# Patient Record
Sex: Male | Born: 1955 | Race: White | Hispanic: No | Marital: Married | Smoking: Former smoker
Health system: Southern US, Community
[De-identification: ages and names within clinical notes are randomized; demographics above are authoritative.]

## PROBLEM LIST (undated history)

## (undated) DIAGNOSIS — E7800 Pure hypercholesterolemia, unspecified: Secondary | ICD-10-CM

## (undated) DIAGNOSIS — Z952 Presence of prosthetic heart valve: Secondary | ICD-10-CM

## (undated) DIAGNOSIS — I714 Abdominal aortic aneurysm, without rupture, unspecified: Secondary | ICD-10-CM

## (undated) DIAGNOSIS — J449 Chronic obstructive pulmonary disease, unspecified: Secondary | ICD-10-CM

## (undated) DIAGNOSIS — I4891 Unspecified atrial fibrillation: Secondary | ICD-10-CM

## (undated) HISTORY — DX: Abdominal aortic aneurysm, without rupture: I71.4

## (undated) HISTORY — DX: Abdominal aortic aneurysm, without rupture, unspecified: I71.40

---

## 1994-05-19 HISTORY — PX: CARDIAC SURGERY: SHX584

## 2007-07-09 ENCOUNTER — Emergency Department: Payer: Self-pay | Admitting: Emergency Medicine

## 2007-07-09 ENCOUNTER — Other Ambulatory Visit: Payer: Self-pay

## 2009-04-23 ENCOUNTER — Ambulatory Visit: Payer: Self-pay | Admitting: General Practice

## 2009-07-04 ENCOUNTER — Ambulatory Visit: Payer: Self-pay | Admitting: General Practice

## 2010-03-29 ENCOUNTER — Emergency Department: Payer: Self-pay | Admitting: Emergency Medicine

## 2010-08-08 ENCOUNTER — Ambulatory Visit: Payer: Self-pay | Admitting: Internal Medicine

## 2010-08-28 ENCOUNTER — Ambulatory Visit: Payer: Self-pay | Admitting: Internal Medicine

## 2010-10-18 ENCOUNTER — Ambulatory Visit: Payer: Self-pay | Admitting: Internal Medicine

## 2010-11-11 ENCOUNTER — Ambulatory Visit: Payer: Self-pay | Admitting: Gastroenterology

## 2010-11-14 LAB — PATHOLOGY REPORT

## 2011-03-24 ENCOUNTER — Emergency Department: Payer: Self-pay | Admitting: Emergency Medicine

## 2011-03-25 ENCOUNTER — Ambulatory Visit: Payer: Self-pay | Admitting: Internal Medicine

## 2011-05-15 ENCOUNTER — Ambulatory Visit: Payer: Self-pay | Admitting: General Practice

## 2014-08-10 ENCOUNTER — Emergency Department: Payer: Self-pay | Admitting: Emergency Medicine

## 2014-08-10 LAB — URINALYSIS, COMPLETE
BILIRUBIN, UR: NEGATIVE
Bacteria: NONE SEEN
Blood: NEGATIVE
Glucose,UR: NEGATIVE mg/dL (ref 0–75)
Ketone: NEGATIVE
Leukocyte Esterase: NEGATIVE
Nitrite: NEGATIVE
PH: 6 (ref 4.5–8.0)
PROTEIN: NEGATIVE
RBC,UR: NONE SEEN /HPF (ref 0–5)
SPECIFIC GRAVITY: 1.006 (ref 1.003–1.030)
WBC UR: NONE SEEN /HPF (ref 0–5)

## 2014-08-10 LAB — COMPREHENSIVE METABOLIC PANEL
ALK PHOS: 48 U/L
ANION GAP: 10 (ref 7–16)
Albumin: 4 g/dL
BILIRUBIN TOTAL: 0.7 mg/dL
BUN: 17 mg/dL
CALCIUM: 8.8 mg/dL — AB
CREATININE: 1.29 mg/dL — AB
Chloride: 105 mmol/L
Co2: 22 mmol/L
Glucose: 175 mg/dL — ABNORMAL HIGH
Potassium: 3.6 mmol/L
SGOT(AST): 34 U/L
SGPT (ALT): 22 U/L
Sodium: 137 mmol/L
Total Protein: 6.5 g/dL

## 2014-08-10 LAB — CBC
HCT: 46.8 % (ref 40.0–52.0)
HGB: 15.7 g/dL (ref 13.0–18.0)
MCH: 27.5 pg (ref 26.0–34.0)
MCHC: 33.5 g/dL (ref 32.0–36.0)
MCV: 82 fL (ref 80–100)
PLATELETS: 248 10*3/uL (ref 150–440)
RBC: 5.69 10*6/uL (ref 4.40–5.90)
RDW: 14.6 % — ABNORMAL HIGH (ref 11.5–14.5)
WBC: 9.5 10*3/uL (ref 3.8–10.6)

## 2014-08-10 LAB — PROTIME-INR
INR: 2.7
PROTHROMBIN TIME: 29.1 s — AB

## 2014-08-10 LAB — CK TOTAL AND CKMB (NOT AT ARMC)
CK, Total: 209 U/L
CK-MB: 1.1 ng/mL

## 2014-08-10 LAB — TROPONIN I

## 2014-08-10 LAB — MAGNESIUM: Magnesium: 1.9 mg/dL

## 2015-03-12 ENCOUNTER — Other Ambulatory Visit: Payer: Self-pay | Admitting: Neurology

## 2015-03-12 DIAGNOSIS — R413 Other amnesia: Secondary | ICD-10-CM

## 2015-03-14 ENCOUNTER — Ambulatory Visit
Admission: RE | Admit: 2015-03-14 | Discharge: 2015-03-14 | Disposition: A | Discharge status: Home / Self Care | Payer: 59 | Source: Ambulatory Visit | Attending: Neurology | Admitting: Neurology

## 2015-03-14 DIAGNOSIS — R413 Other amnesia: Secondary | ICD-10-CM | POA: Diagnosis present

## 2015-03-14 DIAGNOSIS — R9082 White matter disease, unspecified: Secondary | ICD-10-CM | POA: Insufficient documentation

## 2015-03-23 ENCOUNTER — Ambulatory Visit: Payer: 59 | Attending: Neurology

## 2015-03-23 DIAGNOSIS — G4733 Obstructive sleep apnea (adult) (pediatric): Secondary | ICD-10-CM | POA: Insufficient documentation

## 2015-05-29 DIAGNOSIS — Z952 Presence of prosthetic heart valve: Secondary | ICD-10-CM | POA: Diagnosis not present

## 2015-07-05 DIAGNOSIS — E782 Mixed hyperlipidemia: Secondary | ICD-10-CM | POA: Diagnosis not present

## 2015-07-05 DIAGNOSIS — G4733 Obstructive sleep apnea (adult) (pediatric): Secondary | ICD-10-CM | POA: Diagnosis not present

## 2015-07-05 DIAGNOSIS — I480 Paroxysmal atrial fibrillation: Secondary | ICD-10-CM | POA: Diagnosis not present

## 2015-07-05 DIAGNOSIS — I350 Nonrheumatic aortic (valve) stenosis: Secondary | ICD-10-CM | POA: Diagnosis not present

## 2015-07-11 DIAGNOSIS — E782 Mixed hyperlipidemia: Secondary | ICD-10-CM | POA: Diagnosis not present

## 2015-07-11 DIAGNOSIS — R972 Elevated prostate specific antigen [PSA]: Secondary | ICD-10-CM | POA: Diagnosis not present

## 2015-07-11 DIAGNOSIS — I480 Paroxysmal atrial fibrillation: Secondary | ICD-10-CM | POA: Diagnosis not present

## 2015-07-18 DIAGNOSIS — G4733 Obstructive sleep apnea (adult) (pediatric): Secondary | ICD-10-CM | POA: Diagnosis not present

## 2015-07-18 DIAGNOSIS — R413 Other amnesia: Secondary | ICD-10-CM | POA: Diagnosis not present

## 2015-07-18 DIAGNOSIS — E782 Mixed hyperlipidemia: Secondary | ICD-10-CM | POA: Diagnosis not present

## 2015-07-18 DIAGNOSIS — E291 Testicular hypofunction: Secondary | ICD-10-CM | POA: Diagnosis not present

## 2015-07-25 DIAGNOSIS — I350 Nonrheumatic aortic (valve) stenosis: Secondary | ICD-10-CM | POA: Diagnosis not present

## 2015-07-26 DIAGNOSIS — X32XXXA Exposure to sunlight, initial encounter: Secondary | ICD-10-CM | POA: Diagnosis not present

## 2015-07-26 DIAGNOSIS — D2271 Melanocytic nevi of right lower limb, including hip: Secondary | ICD-10-CM | POA: Diagnosis not present

## 2015-07-26 DIAGNOSIS — D225 Melanocytic nevi of trunk: Secondary | ICD-10-CM | POA: Diagnosis not present

## 2015-07-26 DIAGNOSIS — D2262 Melanocytic nevi of left upper limb, including shoulder: Secondary | ICD-10-CM | POA: Diagnosis not present

## 2015-07-26 DIAGNOSIS — D2272 Melanocytic nevi of left lower limb, including hip: Secondary | ICD-10-CM | POA: Diagnosis not present

## 2015-07-26 DIAGNOSIS — L570 Actinic keratosis: Secondary | ICD-10-CM | POA: Diagnosis not present

## 2015-08-07 DIAGNOSIS — I350 Nonrheumatic aortic (valve) stenosis: Secondary | ICD-10-CM | POA: Diagnosis not present

## 2015-08-21 DIAGNOSIS — I350 Nonrheumatic aortic (valve) stenosis: Secondary | ICD-10-CM | POA: Diagnosis not present

## 2015-09-03 DIAGNOSIS — I350 Nonrheumatic aortic (valve) stenosis: Secondary | ICD-10-CM | POA: Diagnosis not present

## 2015-09-12 DIAGNOSIS — I350 Nonrheumatic aortic (valve) stenosis: Secondary | ICD-10-CM | POA: Diagnosis not present

## 2015-10-10 DIAGNOSIS — I350 Nonrheumatic aortic (valve) stenosis: Secondary | ICD-10-CM | POA: Diagnosis not present

## 2015-10-24 DIAGNOSIS — I350 Nonrheumatic aortic (valve) stenosis: Secondary | ICD-10-CM | POA: Diagnosis not present

## 2015-11-21 DIAGNOSIS — I350 Nonrheumatic aortic (valve) stenosis: Secondary | ICD-10-CM | POA: Diagnosis not present

## 2015-12-10 DIAGNOSIS — I350 Nonrheumatic aortic (valve) stenosis: Secondary | ICD-10-CM | POA: Diagnosis not present

## 2015-12-24 DIAGNOSIS — I350 Nonrheumatic aortic (valve) stenosis: Secondary | ICD-10-CM | POA: Diagnosis not present

## 2016-01-02 DIAGNOSIS — I350 Nonrheumatic aortic (valve) stenosis: Secondary | ICD-10-CM | POA: Diagnosis not present

## 2016-01-02 DIAGNOSIS — E782 Mixed hyperlipidemia: Secondary | ICD-10-CM | POA: Diagnosis not present

## 2016-01-02 DIAGNOSIS — I480 Paroxysmal atrial fibrillation: Secondary | ICD-10-CM | POA: Diagnosis not present

## 2016-01-02 DIAGNOSIS — G4733 Obstructive sleep apnea (adult) (pediatric): Secondary | ICD-10-CM | POA: Diagnosis not present

## 2016-01-10 DIAGNOSIS — E782 Mixed hyperlipidemia: Secondary | ICD-10-CM | POA: Diagnosis not present

## 2016-01-10 DIAGNOSIS — Z125 Encounter for screening for malignant neoplasm of prostate: Secondary | ICD-10-CM | POA: Diagnosis not present

## 2016-01-10 DIAGNOSIS — I480 Paroxysmal atrial fibrillation: Secondary | ICD-10-CM | POA: Diagnosis not present

## 2016-01-10 DIAGNOSIS — E291 Testicular hypofunction: Secondary | ICD-10-CM | POA: Diagnosis not present

## 2016-01-10 DIAGNOSIS — I350 Nonrheumatic aortic (valve) stenosis: Secondary | ICD-10-CM | POA: Diagnosis not present

## 2016-01-17 DIAGNOSIS — I480 Paroxysmal atrial fibrillation: Secondary | ICD-10-CM | POA: Diagnosis not present

## 2016-01-17 DIAGNOSIS — I350 Nonrheumatic aortic (valve) stenosis: Secondary | ICD-10-CM | POA: Diagnosis not present

## 2016-01-17 DIAGNOSIS — E782 Mixed hyperlipidemia: Secondary | ICD-10-CM | POA: Diagnosis not present

## 2016-01-17 DIAGNOSIS — R972 Elevated prostate specific antigen [PSA]: Secondary | ICD-10-CM | POA: Diagnosis not present

## 2016-02-11 DIAGNOSIS — I350 Nonrheumatic aortic (valve) stenosis: Secondary | ICD-10-CM | POA: Diagnosis not present

## 2016-02-15 DIAGNOSIS — Z1211 Encounter for screening for malignant neoplasm of colon: Secondary | ICD-10-CM | POA: Diagnosis not present

## 2016-02-15 DIAGNOSIS — Z7901 Long term (current) use of anticoagulants: Secondary | ICD-10-CM | POA: Diagnosis not present

## 2016-02-16 ENCOUNTER — Other Ambulatory Visit: Payer: Self-pay | Admitting: Gastroenterology

## 2016-02-16 DIAGNOSIS — Z7901 Long term (current) use of anticoagulants: Secondary | ICD-10-CM

## 2016-02-16 DIAGNOSIS — Z1211 Encounter for screening for malignant neoplasm of colon: Secondary | ICD-10-CM

## 2016-02-19 DIAGNOSIS — I350 Nonrheumatic aortic (valve) stenosis: Secondary | ICD-10-CM | POA: Diagnosis not present

## 2016-03-03 ENCOUNTER — Ambulatory Visit: Payer: 59

## 2016-03-07 ENCOUNTER — Ambulatory Visit
Admission: RE | Admit: 2016-03-07 | Discharge: 2016-03-07 | Disposition: A | Discharge status: Home / Self Care | Payer: 59 | Source: Ambulatory Visit | Attending: Gastroenterology | Admitting: Gastroenterology

## 2016-03-07 DIAGNOSIS — Z7901 Long term (current) use of anticoagulants: Secondary | ICD-10-CM

## 2016-03-07 DIAGNOSIS — Z1211 Encounter for screening for malignant neoplasm of colon: Secondary | ICD-10-CM | POA: Diagnosis not present

## 2016-03-18 DIAGNOSIS — I350 Nonrheumatic aortic (valve) stenosis: Secondary | ICD-10-CM | POA: Diagnosis not present

## 2016-03-31 DIAGNOSIS — I350 Nonrheumatic aortic (valve) stenosis: Secondary | ICD-10-CM | POA: Diagnosis not present

## 2016-04-28 DIAGNOSIS — I350 Nonrheumatic aortic (valve) stenosis: Secondary | ICD-10-CM | POA: Diagnosis not present

## 2016-04-29 ENCOUNTER — Other Ambulatory Visit: Payer: Self-pay | Admitting: Cardiology

## 2016-04-29 DIAGNOSIS — I350 Nonrheumatic aortic (valve) stenosis: Secondary | ICD-10-CM

## 2016-05-07 ENCOUNTER — Ambulatory Visit: Payer: 59 | Attending: Cardiology

## 2016-05-14 DIAGNOSIS — I350 Nonrheumatic aortic (valve) stenosis: Secondary | ICD-10-CM | POA: Diagnosis not present

## 2016-05-28 DIAGNOSIS — I350 Nonrheumatic aortic (valve) stenosis: Secondary | ICD-10-CM | POA: Diagnosis not present

## 2016-06-17 DIAGNOSIS — I480 Paroxysmal atrial fibrillation: Secondary | ICD-10-CM | POA: Diagnosis not present

## 2016-06-24 DIAGNOSIS — I480 Paroxysmal atrial fibrillation: Secondary | ICD-10-CM | POA: Diagnosis not present

## 2016-07-10 DIAGNOSIS — I350 Nonrheumatic aortic (valve) stenosis: Secondary | ICD-10-CM | POA: Diagnosis not present

## 2016-07-10 DIAGNOSIS — E349 Endocrine disorder, unspecified: Secondary | ICD-10-CM | POA: Diagnosis not present

## 2016-07-10 DIAGNOSIS — I480 Paroxysmal atrial fibrillation: Secondary | ICD-10-CM | POA: Diagnosis not present

## 2016-07-10 DIAGNOSIS — E782 Mixed hyperlipidemia: Secondary | ICD-10-CM | POA: Diagnosis not present

## 2016-07-24 DIAGNOSIS — G4733 Obstructive sleep apnea (adult) (pediatric): Secondary | ICD-10-CM | POA: Diagnosis not present

## 2016-07-24 DIAGNOSIS — I480 Paroxysmal atrial fibrillation: Secondary | ICD-10-CM | POA: Diagnosis not present

## 2016-07-24 DIAGNOSIS — E782 Mixed hyperlipidemia: Secondary | ICD-10-CM | POA: Diagnosis not present

## 2016-07-24 DIAGNOSIS — Z720 Tobacco use: Secondary | ICD-10-CM | POA: Diagnosis not present

## 2016-07-25 DIAGNOSIS — X32XXXA Exposure to sunlight, initial encounter: Secondary | ICD-10-CM | POA: Diagnosis not present

## 2016-07-25 DIAGNOSIS — D2261 Melanocytic nevi of right upper limb, including shoulder: Secondary | ICD-10-CM | POA: Diagnosis not present

## 2016-07-25 DIAGNOSIS — D225 Melanocytic nevi of trunk: Secondary | ICD-10-CM | POA: Diagnosis not present

## 2016-07-25 DIAGNOSIS — L570 Actinic keratosis: Secondary | ICD-10-CM | POA: Diagnosis not present

## 2016-07-25 DIAGNOSIS — D2272 Melanocytic nevi of left lower limb, including hip: Secondary | ICD-10-CM | POA: Diagnosis not present

## 2016-07-25 DIAGNOSIS — L308 Other specified dermatitis: Secondary | ICD-10-CM | POA: Diagnosis not present

## 2016-08-04 DIAGNOSIS — Z7982 Long term (current) use of aspirin: Secondary | ICD-10-CM | POA: Diagnosis not present

## 2016-08-04 DIAGNOSIS — E7800 Pure hypercholesterolemia, unspecified: Secondary | ICD-10-CM | POA: Diagnosis not present

## 2016-08-04 DIAGNOSIS — R0600 Dyspnea, unspecified: Secondary | ICD-10-CM | POA: Diagnosis not present

## 2016-08-04 DIAGNOSIS — F17210 Nicotine dependence, cigarettes, uncomplicated: Secondary | ICD-10-CM | POA: Diagnosis not present

## 2016-08-04 DIAGNOSIS — Z7901 Long term (current) use of anticoagulants: Secondary | ICD-10-CM | POA: Diagnosis not present

## 2016-08-04 DIAGNOSIS — Z79899 Other long term (current) drug therapy: Secondary | ICD-10-CM | POA: Diagnosis not present

## 2016-08-04 DIAGNOSIS — I480 Paroxysmal atrial fibrillation: Secondary | ICD-10-CM | POA: Diagnosis not present

## 2016-08-11 DIAGNOSIS — I480 Paroxysmal atrial fibrillation: Secondary | ICD-10-CM | POA: Diagnosis not present

## 2016-08-25 DIAGNOSIS — Z7901 Long term (current) use of anticoagulants: Secondary | ICD-10-CM | POA: Diagnosis not present

## 2016-09-03 DIAGNOSIS — Z7901 Long term (current) use of anticoagulants: Secondary | ICD-10-CM | POA: Diagnosis not present

## 2016-10-01 DIAGNOSIS — I480 Paroxysmal atrial fibrillation: Secondary | ICD-10-CM | POA: Diagnosis not present

## 2016-10-29 DIAGNOSIS — I480 Paroxysmal atrial fibrillation: Secondary | ICD-10-CM | POA: Diagnosis not present

## 2016-11-12 DIAGNOSIS — Z5181 Encounter for therapeutic drug level monitoring: Secondary | ICD-10-CM | POA: Diagnosis not present

## 2016-11-12 DIAGNOSIS — Z7901 Long term (current) use of anticoagulants: Secondary | ICD-10-CM | POA: Diagnosis not present

## 2016-12-10 DIAGNOSIS — I480 Paroxysmal atrial fibrillation: Secondary | ICD-10-CM | POA: Diagnosis not present

## 2016-12-22 DIAGNOSIS — R791 Abnormal coagulation profile: Secondary | ICD-10-CM | POA: Diagnosis not present

## 2017-01-12 DIAGNOSIS — G4733 Obstructive sleep apnea (adult) (pediatric): Secondary | ICD-10-CM | POA: Diagnosis not present

## 2017-01-12 DIAGNOSIS — I480 Paroxysmal atrial fibrillation: Secondary | ICD-10-CM | POA: Diagnosis not present

## 2017-01-12 DIAGNOSIS — I350 Nonrheumatic aortic (valve) stenosis: Secondary | ICD-10-CM | POA: Diagnosis not present

## 2017-01-12 DIAGNOSIS — E782 Mixed hyperlipidemia: Secondary | ICD-10-CM | POA: Diagnosis not present

## 2017-01-28 DIAGNOSIS — E349 Endocrine disorder, unspecified: Secondary | ICD-10-CM | POA: Diagnosis not present

## 2017-01-28 DIAGNOSIS — I480 Paroxysmal atrial fibrillation: Secondary | ICD-10-CM | POA: Diagnosis not present

## 2017-01-28 DIAGNOSIS — E782 Mixed hyperlipidemia: Secondary | ICD-10-CM | POA: Diagnosis not present

## 2017-02-03 DIAGNOSIS — E782 Mixed hyperlipidemia: Secondary | ICD-10-CM | POA: Diagnosis not present

## 2017-02-03 DIAGNOSIS — G4733 Obstructive sleep apnea (adult) (pediatric): Secondary | ICD-10-CM | POA: Diagnosis not present

## 2017-02-03 DIAGNOSIS — M4696 Unspecified inflammatory spondylopathy, lumbar region: Secondary | ICD-10-CM | POA: Diagnosis not present

## 2017-02-03 DIAGNOSIS — Z0000 Encounter for general adult medical examination without abnormal findings: Secondary | ICD-10-CM | POA: Diagnosis not present

## 2017-02-11 DIAGNOSIS — Z7901 Long term (current) use of anticoagulants: Secondary | ICD-10-CM | POA: Diagnosis not present

## 2017-02-11 DIAGNOSIS — Z5181 Encounter for therapeutic drug level monitoring: Secondary | ICD-10-CM | POA: Diagnosis not present

## 2017-03-11 DIAGNOSIS — I480 Paroxysmal atrial fibrillation: Secondary | ICD-10-CM | POA: Diagnosis not present

## 2017-03-24 DIAGNOSIS — R791 Abnormal coagulation profile: Secondary | ICD-10-CM | POA: Diagnosis not present

## 2017-04-07 DIAGNOSIS — R791 Abnormal coagulation profile: Secondary | ICD-10-CM | POA: Diagnosis not present

## 2017-04-16 DIAGNOSIS — H524 Presbyopia: Secondary | ICD-10-CM | POA: Diagnosis not present

## 2018-04-06 ENCOUNTER — Emergency Department: Payer: PRIVATE HEALTH INSURANCE

## 2018-04-06 ENCOUNTER — Other Ambulatory Visit: Payer: Self-pay

## 2018-04-06 ENCOUNTER — Encounter: Payer: Self-pay | Admitting: Emergency Medicine

## 2018-04-06 ENCOUNTER — Observation Stay
Admission: EM | Admit: 2018-04-06 | Discharge: 2018-04-07 | Disposition: A | Discharge status: Home / Self Care | Payer: PRIVATE HEALTH INSURANCE | Attending: Internal Medicine | Admitting: Internal Medicine

## 2018-04-06 DIAGNOSIS — Z880 Allergy status to penicillin: Secondary | ICD-10-CM | POA: Diagnosis not present

## 2018-04-06 DIAGNOSIS — Z7901 Long term (current) use of anticoagulants: Secondary | ICD-10-CM | POA: Diagnosis not present

## 2018-04-06 DIAGNOSIS — I4891 Unspecified atrial fibrillation: Secondary | ICD-10-CM

## 2018-04-06 DIAGNOSIS — Z79899 Other long term (current) drug therapy: Secondary | ICD-10-CM | POA: Insufficient documentation

## 2018-04-06 DIAGNOSIS — E7800 Pure hypercholesterolemia, unspecified: Secondary | ICD-10-CM | POA: Diagnosis not present

## 2018-04-06 DIAGNOSIS — I480 Paroxysmal atrial fibrillation: Principal | ICD-10-CM | POA: Insufficient documentation

## 2018-04-06 DIAGNOSIS — F17200 Nicotine dependence, unspecified, uncomplicated: Secondary | ICD-10-CM | POA: Diagnosis not present

## 2018-04-06 DIAGNOSIS — I7121 Aneurysm of the ascending aorta, without rupture: Secondary | ICD-10-CM

## 2018-04-06 DIAGNOSIS — Z7982 Long term (current) use of aspirin: Secondary | ICD-10-CM | POA: Insufficient documentation

## 2018-04-06 DIAGNOSIS — Z8249 Family history of ischemic heart disease and other diseases of the circulatory system: Secondary | ICD-10-CM | POA: Diagnosis not present

## 2018-04-06 DIAGNOSIS — Z952 Presence of prosthetic heart valve: Secondary | ICD-10-CM | POA: Insufficient documentation

## 2018-04-06 DIAGNOSIS — Z23 Encounter for immunization: Secondary | ICD-10-CM | POA: Insufficient documentation

## 2018-04-06 DIAGNOSIS — I712 Thoracic aortic aneurysm, without rupture: Secondary | ICD-10-CM | POA: Diagnosis present

## 2018-04-06 DIAGNOSIS — I4892 Unspecified atrial flutter: Secondary | ICD-10-CM

## 2018-04-06 DIAGNOSIS — I714 Abdominal aortic aneurysm, without rupture: Secondary | ICD-10-CM | POA: Diagnosis not present

## 2018-04-06 HISTORY — DX: Presence of prosthetic heart valve: Z95.2

## 2018-04-06 HISTORY — DX: Unspecified atrial fibrillation: I48.91

## 2018-04-06 HISTORY — DX: Pure hypercholesterolemia, unspecified: E78.00

## 2018-04-06 LAB — BASIC METABOLIC PANEL
ANION GAP: 10 (ref 5–15)
BUN: 12 mg/dL (ref 6–20)
CHLORIDE: 107 mmol/L (ref 98–111)
CO2: 22 mmol/L (ref 22–32)
Calcium: 9 mg/dL (ref 8.9–10.3)
Creatinine, Ser: 1.08 mg/dL (ref 0.61–1.24)
GFR calc Af Amer: >60 mL/min (ref >60)
GFR calc non Af Amer: >60 mL/min (ref >60)
GLUCOSE: 113 mg/dL — AB (ref 70–99)
POTASSIUM: 4.5 mmol/L (ref 3.5–5.1)
Sodium: 139 mmol/L (ref 135–145)

## 2018-04-06 LAB — CBC WITH DIFFERENTIAL/PLATELET
Abs Immature Granulocytes: 0.05 10*3/uL (ref 0.00–0.07)
BASOS ABS: 0.1 10*3/uL (ref 0.0–0.1)
Basophils Relative: 1 %
EOS ABS: 0.2 10*3/uL (ref 0.0–0.5)
Eosinophils Relative: 2 %
HEMATOCRIT: 51.2 % (ref 39.0–52.0)
HEMOGLOBIN: 17.6 g/dL — AB (ref 13.0–17.0)
Immature Granulocytes: 1 %
LYMPHS ABS: 1.9 10*3/uL (ref 0.7–4.0)
Lymphocytes Relative: 20 %
MCH: 28.7 pg (ref 26.0–34.0)
MCHC: 34.4 g/dL (ref 30.0–36.0)
MCV: 83.5 fL (ref 80.0–100.0)
MONOS PCT: 8 %
Monocytes Absolute: 0.8 10*3/uL (ref 0.1–1.0)
NEUTROS PCT: 68 %
NRBC: 0 % (ref 0.0–0.2)
Neutro Abs: 6.7 10*3/uL (ref 1.7–7.7)
Platelets: 312 10*3/uL (ref 150–400)
RBC: 6.13 MIL/uL — ABNORMAL HIGH (ref 4.22–5.81)
RDW: 13.6 % (ref 11.5–15.5)
WBC: 9.7 10*3/uL (ref 4.0–10.5)

## 2018-04-06 LAB — TROPONIN I
TROPONIN I: 0.03 ng/mL — AB (ref <0.03)
Troponin I: <0.03 ng/mL (ref <0.03)
Troponin I: 0.03 ng/mL (ref <0.03)

## 2018-04-06 LAB — PROTIME-INR
INR: 3.01
Prothrombin Time: 30.8 seconds — ABNORMAL HIGH (ref 11.4–15.2)

## 2018-04-06 LAB — TSH: TSH: 2.309 u[IU]/mL (ref 0.350–4.500)

## 2018-04-06 MED ORDER — ASPIRIN EC 81 MG PO TBEC
81.0000 mg | DELAYED_RELEASE_TABLET | Freq: Every day | ORAL | Status: DC
Start: 2018-04-07 — End: 2018-04-07
  Administered 2018-04-07: 81 mg via ORAL
  Filled 2018-04-06: qty 1

## 2018-04-06 MED ORDER — SODIUM CHLORIDE 0.9 % IV BOLUS
1000.0000 mL | Freq: Once | INTRAVENOUS | Status: AC
  Administered 2018-04-06: 1000 mL via INTRAVENOUS

## 2018-04-06 MED ORDER — SERTRALINE HCL 50 MG PO TABS
25.0000 mg | ORAL_TABLET | Freq: Every day | ORAL | Status: DC
  Administered 2018-04-06 – 2018-04-07 (×2): 25 mg via ORAL
  Filled 2018-04-06 (×2): qty 1

## 2018-04-06 MED ORDER — INFLUENZA VAC SPLIT QUAD 0.5 ML IM SUSY
0.5000 mL | PREFILLED_SYRINGE | INTRAMUSCULAR | Status: AC
  Administered 2018-04-07: 0.5 mL via INTRAMUSCULAR
  Filled 2018-04-06: qty 0.5

## 2018-04-06 MED ORDER — METOPROLOL TARTRATE 25 MG PO TABS
25.0000 mg | ORAL_TABLET | Freq: Two times a day (BID) | ORAL | Status: DC
  Filled 2018-04-06 (×2): qty 1

## 2018-04-06 MED ORDER — WARFARIN - PHARMACIST DOSING INPATIENT
Freq: Every day | Status: DC
  Filled 2018-04-06: qty 1

## 2018-04-06 MED ORDER — DILTIAZEM HCL-DEXTROSE 100-5 MG/100ML-% IV SOLN (PREMIX)
5.0000 mg/h | INTRAVENOUS | Status: DC
  Administered 2018-04-06: 5 mg/h via INTRAVENOUS
  Filled 2018-04-06: qty 100

## 2018-04-06 MED ORDER — SODIUM CHLORIDE 0.9 % IV BOLUS
500.0000 mL | Freq: Once | INTRAVENOUS | Status: AC
  Administered 2018-04-06: 500 mL via INTRAVENOUS

## 2018-04-06 MED ORDER — METOPROLOL TARTRATE 5 MG/5ML IV SOLN
5.0000 mg | Freq: Once | INTRAVENOUS | Status: AC
  Administered 2018-04-06: 5 mg via INTRAVENOUS
  Filled 2018-04-06: qty 5

## 2018-04-06 MED ORDER — DOCUSATE SODIUM 100 MG PO CAPS: 100.0000 mg | ORAL_CAPSULE | Freq: Two times a day (BID) | ORAL | Status: DC | PRN

## 2018-04-06 MED ORDER — EZETIMIBE 10 MG PO TABS
10.0000 mg | ORAL_TABLET | Freq: Every day | ORAL | Status: DC
  Administered 2018-04-06 – 2018-04-07 (×2): 10 mg via ORAL
  Filled 2018-04-06 (×2): qty 1

## 2018-04-06 MED ORDER — TRAZODONE HCL 50 MG PO TABS
50.0000 mg | ORAL_TABLET | Freq: Every day | ORAL | Status: DC
  Administered 2018-04-06: 50 mg via ORAL
  Filled 2018-04-06: qty 1

## 2018-04-06 MED ORDER — WARFARIN SODIUM 6 MG PO TABS
9.0000 mg | ORAL_TABLET | Freq: Every morning | ORAL | Status: DC
  Filled 2018-04-06: qty 1

## 2018-04-06 MED ORDER — IOHEXOL 350 MG/ML SOLN
75.0000 mL | Freq: Once | INTRAVENOUS | Status: AC | PRN
  Administered 2018-04-06: 75 mL via INTRAVENOUS

## 2018-04-06 NOTE — ED Notes (Signed)
Dr. Marthann Schiller paged regarding patients blood pressure. MD comfortable with patients current floor status.

## 2018-04-06 NOTE — Progress Notes (Signed)
Family Meeting Note  Advance Directive:yes  Today a meeting took place with the Patient and spouse.   The following clinical team members were present during this meeting:MD  The following were discussed:Patient's diagnosis: Atrial fibrillation, mechanical aortic valve, hyperlipidemia, Patient's progosis: Unable to determine and Goals for treatment: Full Code  Additional follow-up to be provided: Cardiology  Time spent during discussion:20 minutes  Vaughan Basta, MD

## 2018-04-06 NOTE — ED Notes (Signed)
Patient transported to CT 

## 2018-04-06 NOTE — Discharge Instructions (Addendum)
You are given medication to help control racing heartbeat.  You are currently in atrial fibrillation but rate controlled.  Please restart metoprolol 25 mg twice daily as prescribed previously.  As we discussed and you have a copy of the report, your CT scan of the chest does show an aneurysm of the ascending aorta, and you need to follow-up with a cardiothoracic surgeon that can be referred by your primary care doctor Dr. Caryl Comes or Dr. Ubaldo Glassing.  Return to the emergency room immediately for any worsening condition including chest pain, shortness of breath, trouble breathing, dizziness or passing out, or any other symptoms concerning to you.

## 2018-04-06 NOTE — Consult Note (Signed)
ANTICOAGULATION CONSULT NOTE - Initial Consult  Pharmacy Consult for Warfarin Indication: atrial fibrillation/mechanical valve  Allergies  Allergen Reactions  . Penicillins Rash    Patient Measurements: Height: 5\' 10"  (177.8 cm) Weight: 160 lb (72.6 kg) IBW/kg (Calculated) : 73   Vital Signs: Temp: 97.5 F (36.4 C) (11/19 1050) Temp Source: Oral (11/19 1050) BP: 101/81 (11/19 1400) Pulse Rate: 123 (11/19 1400)  Labs: Recent Labs    04/06/18 1058  HGB 17.6*  HCT 51.2  PLT 312  LABPROT 30.8*  INR 3.01  CREATININE 1.08  TROPONINI <0.03    Estimated Creatinine Clearance: 74.7 mL/min (by C-G formula based on SCr of 1.08 mg/dL).   Medical History: Past Medical History:  Diagnosis Date  . Atrial fibrillation ([place])   . High cholesterol   . Status post mechanical aortic valve replacement     Medications:  Scheduled:  . [START ON 04/07/2018] warfarin  9 mg Oral q morning - 10a  . Warfarin - Pharmacist Dosing Inpatient   Does not apply q1800    Assessment: 62 yo male with atrial fibrillation, has been cardioverted twice. Currently in atrial flutter. Presented with tachycardia and SOB. Patient takes Warfarin 9 mg at home daily.  Goal of Therapy:  Mechanical valve and afib: 2.5-3.5 Monitor platelets by anticoagulation protocol: Yes   Plan:  11/19 INR 3.01 Continued Warfarin 9 mg, next dose tomorrow. Last dose was this AM.  Pharmacy will continue to monitor.  Paticia Stack, PharmD Pharmacy Resident  04/06/2018 3:04 PM

## 2018-04-06 NOTE — ED Notes (Signed)
Admitting MD at bedside.

## 2018-04-06 NOTE — Progress Notes (Signed)
Contacted by Particia Nearing, RN about patient's vital signs.Spoke with patient's RN in ED, requested she contact Dr. Marthann Schiller about vital signs. ED RN Stated patient off cardizem drip

## 2018-04-06 NOTE — ED Notes (Signed)
Upon discharge, patients blood pressure 87 systolic. Blood pressure taken in both arms. Dr. Reita Cliche made aware. 51ml bolus started at this time.

## 2018-04-06 NOTE — H&P (Addendum)
[facility] NAME: Tyler Valencia    MR#:  [id number]  DATE OF BIRTH:  03/22/1956  DATE OF ADMISSION:  04/06/2018  PRIMARY CARE PHYSICIAN: Tyler Hector, MD   REQUESTING/REFERRING PHYSICIAN: lord   CHIEF COMPLAINT:   Chief Complaint  Patient presents with  . Tachycardia  . Shortness of Breath    HISTORY OF PRESENT ILLNESS: Tyler Valencia  is a 62 y.o. male with a known history of A. fib, high cholesterol, status post mechanical aortic valve-on Coumadin at home. He was advised by Dr. Ubaldo Glassing to have metoprolol tablet twice a day.  Last 5 to 24-month he is only taking it once a day in the morning without informing his cardiologist, as he was feeling fine with just one time dose. Today morning he started feeling chest tightness and shortness of breath and he knew he is in A. fib because he had similar episodes in the past. He came to emergency room and noted to have atrial fibrillation with rapid ventricular response. Started on Cardizem drip by ER physician and given to hospitalist team for further management.  PAST MEDICAL HISTORY:   Past Medical History:  Diagnosis Date  . Atrial fibrillation ([place])   . High cholesterol   . Status post mechanical aortic valve replacement     PAST SURGICAL HISTORY:  Past Surgical History:  Procedure Laterality Date  . CARDIAC SURGERY  1996   aortic valve    SOCIAL HISTORY:  Social History   Tobacco Use  . Smoking status: Current Every Day Smoker    Packs/day: 0.50  . Smokeless tobacco: Never Used  Substance Use Topics  . Alcohol use: Not on file    FAMILY HISTORY:  Family History  Problem Relation Age of Onset  . CAD Mother     DRUG ALLERGIES:  Allergies  Allergen Reactions  . Penicillins Rash    REVIEW OF SYSTEMS:   CONSTITUTIONAL: No fever, fatigue or weakness.  EYES: No blurred or double vision.  EARS, NOSE, AND THROAT: No tinnitus or ear pain.  RESPIRATORY: No cough,  shortness of breath, wheezing or hemoptysis.  CARDIOVASCULAR: No chest pain, orthopnea, edema.  GASTROINTESTINAL: No nausea, vomiting, diarrhea or abdominal pain.  GENITOURINARY: No dysuria, hematuria.  ENDOCRINE: No polyuria, nocturia,  HEMATOLOGY: No anemia, easy bruising or bleeding SKIN: No rash or lesion. MUSCULOSKELETAL: No joint pain or arthritis.   NEUROLOGIC: No tingling, numbness, weakness.  PSYCHIATRY: No anxiety or depression.   MEDICATIONS AT HOME:  Prior to Admission medications   Medication Sig Start Date End Date Taking? Authorizing Provider  aspirin EC 81 MG tablet Take 81 mg by mouth daily.   Yes [provider]  ezetimibe (ZETIA) 10 MG tablet Take 10 mg by mouth daily.   Yes [provider]  metoprolol tartrate (LOPRESSOR) 25 MG tablet Take 25 mg by mouth 2 (two) times daily.   Yes [provider]  sertraline (ZOLOFT) 25 MG tablet Take 25 mg by mouth daily.   Yes [provider]  traZODone (DESYREL) 50 MG tablet Take 50 mg by mouth at bedtime.   Yes [provider]  warfarin (COUMADIN) 4 MG tablet Take 4 mg by mouth daily. Take in addition to 5mg  for a total of 9mg  daily   Yes [provider]  warfarin (COUMADIN) 5 MG tablet Take 5 mg by mouth daily. Take in addition to 4mg  for a total of 9mg  daily   Yes  [provider]      PHYSICAL EXAMINATION:   VITAL SIGNS: Blood pressure 101/81, pulse (!) 123, temperature (!) 97.5 F (36.4 C), temperature source Oral, resp. rate 16, height 5\' 10"  (1.778 m), weight 72.6 kg, SpO2 96 %.  GENERAL:  62 y.o.-year-old patient lying in the bed with no acute distress.  EYES: Pupils equal, round, reactive to light and accommodation. No scleral icterus. Extraocular muscles intact.  HEENT: Head atraumatic, normocephalic. Oropharynx and nasopharynx clear.  NECK:  Supple, no jugular venous distention. No thyroid enlargement, no tenderness.  LUNGS: Normal breath sounds  bilaterally, no wheezing, rales,rhonchi or crepitation. No use of accessory muscles of respiration.  CARDIOVASCULAR: S1, S2 fast, regular. No murmurs, rubs, or gallops.  ABDOMEN: Soft, nontender, nondistended. Bowel sounds present. No organomegaly or mass.  EXTREMITIES: No pedal edema, cyanosis, or clubbing.  NEUROLOGIC: Cranial nerves II through XII are intact. Muscle strength 5/5 in all extremities. Sensation intact. Gait not checked.  PSYCHIATRIC: The patient is alert and oriented x 3.  SKIN: No obvious rash, lesion, or ulcer.   LABORATORY PANEL:   CBC Recent Labs  Lab 04/06/18 1058  WBC 9.7  HGB 17.6*  HCT 51.2  PLT 312  MCV 83.5  MCH 28.7  MCHC 34.4  RDW 13.6  LYMPHSABS 1.9  MONOABS 0.8  EOSABS 0.2  BASOSABS 0.1   ------------------------------------------------------------------------------------------------------------------  Chemistries  Recent Labs  Lab 04/06/18 1058  NA 139  K 4.5  CL 107  CO2 22  GLUCOSE 113*  BUN 12  CREATININE 1.08  CALCIUM 9.0   ------------------------------------------------------------------------------------------------------------------ estimated creatinine clearance is 74.7 mL/min (by C-G formula based on SCr of 1.08 mg/dL). ------------------------------------------------------------------------------------------------------------------ Recent Labs    04/06/18 1058  TSH 2.309     Coagulation profile Recent Labs  Lab 04/06/18 1058  INR 3.01   ------------------------------------------------------------------------------------------------------------------- No results for input(s): DDIMER in the last 72 hours. -------------------------------------------------------------------------------------------------------------------  Cardiac Enzymes Recent Labs  Lab 04/06/18 1058  TROPONINI <0.03   ------------------------------------------------------------------------------------------------------------------ Invalid  input(s): POCBNP  ---------------------------------------------------------------------------------------------------------------  Urinalysis    Component Value Date/Time   COLORURINE Straw 08/10/2014 1710   APPEARANCEUR Clear 08/10/2014 1710   LABSPEC 1.006 08/10/2014 1710   PHURINE 6.0 08/10/2014 1710   GLUCOSEU Negative 08/10/2014 1710   HGBUR Negative 08/10/2014 1710   BILIRUBINUR Negative 08/10/2014 1710   KETONESUR Negative 08/10/2014 1710   PROTEINUR Negative 08/10/2014 1710   NITRITE Negative 08/10/2014 1710   LEUKOCYTESUR Negative 08/10/2014 1710     RADIOLOGY: Dg Chest Port 1 View  Result Date: 04/06/2018 CLINICAL DATA:  Tachycardia and shortness of breath EXAM: PORTABLE CHEST 1 VIEW COMPARISON:  August 04, 2016 FINDINGS: There is no edema or consolidation. The heart size is normal. Pulmonary vascularity appears normal. There is soft tissue prominence overlying the right hilum potentially arising from the ascending aorta. There is aortic atherosclerosis. Patient is status post median sternotomy. There is no evident adenopathy. No bone lesions. IMPRESSION: New prominence in the region of the ascending thoracic aorta. Question dilatation of the ascending aorta versus potential adenopathy on the right side. Given apparent change from the 2018 study, chest CT, ideally with intravenous contrast, is advised to further evaluate. No edema or consolidation. Stable previous median sternotomy. Aortic atherosclerosis noted. Aortic Atherosclerosis (ICD10-I70.0). Electronically Signed   By: Lowella Grip III M.D.   On: 04/06/2018 14:00   Ct Angio Chest Aorta W And/or Wo Contrast  Result Date: 04/06/2018 CLINICAL DATA:  Chest pain.  History of atrial fibrillation. EXAM: CT ANGIOGRAPHY  CHEST WITH CONTRAST TECHNIQUE: Initially, axial CT images were obtained through the chest without intravenous contrast material. Multidetector CT imaging of the chest was performed using the standard  protocol during bolus administration of intravenous contrast. Multiplanar CT image reconstructions and MIPs were obtained to evaluate the vascular anatomy. CONTRAST:  18mL OMNIPAQUE IOHEXOL 350 MG/ML SOLN COMPARISON:  Chest radiograph April 06, 2018 FINDINGS: Cardiovascular: No intramural hematoma is evident on the precontrast study. Patient is status post aortic valve replacement. There is aneurysmal dilatation of the ascending thoracic aorta measuring 4.9 x 4.8 cm. No dissection evident. Visualized great vessels appear normal except for minimal calcification at the origin of the left subclavian artery. There is aortic atherosclerosis. There are occasional foci of coronary artery calcification. No demonstrable pulmonary embolus. No evident pericardial effusion or pericardial thickening. Mediastinum/Nodes: Thyroid appears unremarkable. There are scattered subcentimeter mediastinal lymph nodes. There is a right hilar lymph node measuring 1.3 x 1.2 cm. No other lymph nodes meeting size criteria for adenopathy evident. No esophageal lesions are evident. Lungs/Pleura: There is bullous disease in each upper lobe near the apex, more on the right than on the left. There is apical regions scarring bilaterally. Bullae are seen medially throughout the right upper lobe region. There are areas of mild atelectatic change. There is no edema or consolidation. There is a slight degree of proximal lower lobe bronchiectatic change. No pleural effusion or pleural thickening evident. Upper Abdomen: There is hepatic steatosis. Visualized upper abdominal structures otherwise appear unremarkable. Musculoskeletal: The patient is status post median sternotomy. No blastic or lytic bone lesions. No evident chest wall lesions. Review of the MIP images confirms the above findings. IMPRESSION: 1. Ascending thoracic aorta measures 4.9 x 4.8 cm. Ascending thoracic aortic aneurysm. Recommend semi-annual imaging followup by CTA or MRA and  referral to cardiothoracic surgery if not already obtained. This recommendation follows 2010 ACCF/AHA/AATS/ACR/ASA/SCA/SCAI/SIR/STS/SVM Guidelines for the Diagnosis and Management of Patients With Thoracic Aortic Disease. Circulation. 2010; 121: F026-V785. No thoracic aortic dissection. There are foci of aortic atherosclerosis as well as foci of coronary artery calcification. No demonstrable pulmonary embolus. 2. 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 Areas of mild atelectatic change. No edema or consolidation. Scarring in both apices noted. Mild proximal lower lobe bronchiectatic change bilaterally. 3.  Mildly prominent right hilar lymph node of uncertain etiology. 4.  Status post aortic valve replacement. 5.  Hepatic steatosis. Comment: The prominence on the right on recent chest radiograph is due to aneurysmal dilatation of the ascending thoracic aorta. Aortic Atherosclerosis (ICD10-I70.0). Electronically Signed   By: Lowella Grip III M.D.   On: 04/06/2018 15:00    EKG: Orders placed or performed during the hospital encounter of 04/06/18  . EKG 12-Lead  . EKG 12-Lead    IMPRESSION AND PLAN:  *Atrial fibrillation with RVR IV Cardizem drip as started by ER. Metoprolol oral continue. As patient was not taking metoprolol twice a day as advised, this may be the reason and we may not need to change the dose. Called cardiology consult. Check TSH. Patient is already on Coumadin and therapeutic INR.  Continue to monitor.  * mechanical aortic valve   INR is 3, cont coumadin  * Hypercholesterolemia   Cont Zetia.  *Active smoking Counseled to quit smoking for 4 minutes and offered nicotine patch.  All the records are reviewed and case discussed with ED provider. Management plans discussed with the patient, family and they are in agreement.  CODE STATUS: Full.  Patient wife and daughter were present in the room during my visit.  TOTAL TIME  TAKING CARE OF THIS PATIENT: 45 minutes.    Vaughan Basta M.D on 04/06/2018   Between 7am to 6pm - Pager - [phone]  After 6pm go to [email or web address] - password [email or web address]  Office  [phone]  CC: Primary care physician; Tyler Hector, MD   Note: This dictation was prepared with Dragon dictation along with smaller phrase technology. Any transcriptional errors that result from this process are unintentional.

## 2018-04-06 NOTE — ED Notes (Signed)
Pt returned from CT.  Attempted to call report to floor.

## 2018-04-06 NOTE — ED Provider Notes (Addendum)
[facility] Emergency Department Provider Note ____________________________________________   I have reviewed the triage vital signs and the triage nursing note.  HISTORY  Chief Complaint Tachycardia and Shortness of Breath   Historian Patient  HPI Tyler Valencia is a 62 y.o. male with a history of paroxysmal atrial fibrillation, history of cardioversion x2 in the past, follows with cardiologist Dr. Ubaldo Glassing, supposed to be taking metoprolol twice daily, but states that for the last several months he has been taking it just once daily because he felt like he probably did not need it.  This morning he woke up short of breath with a chest heaviness sensation which is similar to when he was noted to be in A. fib before.  No chest pain.  No coughing or congestion.  No nausea.  No neck or arm pain.  No altered mental status.    He does take warfarin   Past Medical History:  Diagnosis Date  . Atrial fibrillation ([place])   . High cholesterol   . Status post mechanical aortic valve replacement     Patient Active Problem List   Diagnosis Date Noted  . Atrial fibrillation with RVR ([place]) 04/06/2018    Past Surgical History:  Procedure Laterality Date  . CARDIAC SURGERY  1996   aortic valve    Prior to Admission medications   Medication Sig Start Date End Date Taking? Authorizing Provider  aspirin EC 81 MG tablet Take 81 mg by mouth daily.   Yes [provider]  ezetimibe (ZETIA) 10 MG tablet Take 10 mg by mouth daily.   Yes [provider]  metoprolol tartrate (LOPRESSOR) 25 MG tablet Take 25 mg by mouth 2 (two) times daily.   Yes [provider]  sertraline (ZOLOFT) 25 MG tablet Take 25 mg by mouth daily.   Yes [provider]  traZODone (DESYREL) 50 MG tablet Take 50 mg by mouth at bedtime.   Yes [provider]  warfarin (COUMADIN) 4 MG tablet Take 4 mg by mouth daily. Take in addition to 5mg  for a total of  9mg  daily   Yes [provider]  warfarin (COUMADIN) 5 MG tablet Take 5 mg by mouth daily. Take in addition to 4mg  for a total of 9mg  daily   Yes [provider]    Allergies  Allergen Reactions  . Penicillins Rash    Family History  Problem Relation Age of Onset  . CAD Mother     Social History Social History   Tobacco Use  . Smoking status: Current Every Day Smoker    Packs/day: 0.50  . Smokeless tobacco: Never Used  Substance Use Topics  . Alcohol use: Not on file  . Drug use: Not on file    Review of Systems  Constitutional: Negative for fever. Eyes: Negative for visual changes. ENT: Negative for sore throat. Cardiovascular: Negative for chest pain.  Positive for some chest heaviness as per HPI. Respiratory: Mild shortness of breath, without any pleuritic chest discomfort, or any coughing. Gastrointestinal: Negative for abdominal pain, vomiting and diarrhea. Genitourinary: Negative for dysuria. Musculoskeletal: Negative for back pain. Skin: Negative for rash. Neurological: Negative for headache.  ____________________________________________   PHYSICAL EXAM:  VITAL SIGNS: ED Triage Vitals [04/06/18 1050]  Enc Vitals Group     BP (!) 151/106     Pulse Rate (!) 122     Resp 20     Temp (!) 97.5 F (36.4 C)     Temp Source  Oral     SpO2 98 %     Weight 160 lb (72.6 kg)     Height 5\' 10"  (1.778 m)     Head Circumference      Peak Flow      Pain Score 0     Pain Loc      Pain Edu?      Excl. in [place]?      Constitutional: Alert and oriented.  HEENT      Head: Normocephalic and atraumatic.      Eyes: Conjunctivae are normal. Pupils equal and round.       Ears:         Nose: No congestion/rhinnorhea.      Mouth/Throat: Mucous membranes are moist.      Neck: No stridor. Cardiovascular/Chest: Irregularly irregular rhythm, tachycardic.  No murmurs, rubs, or gallops. Respiratory: Normal respiratory effort without tachypnea nor  retractions. Breath sounds are clear and equal bilaterally. No wheezes/rales/rhonchi. Gastrointestinal: Soft. No distention, no guarding, no rebound. Nontender.    Genitourinary/rectal:Deferred Musculoskeletal: Nontender with normal range of motion in all extremities. No joint effusions.  No lower extremity tenderness.  No edema. Neurologic:  Normal speech and language. No gross or focal neurologic deficits are appreciated. Skin:  Skin is warm, dry and intact. No rash noted. Psychiatric: Mood and affect are normal. Speech and behavior are normal. Patient exhibits appropriate insight and judgment.   ____________________________________________  LABS (pertinent positives/negatives) I, Lisa Roca, MD the attending physician have reviewed the labs noted below.  Labs Reviewed  BASIC METABOLIC PANEL - Abnormal; Notable for the following components:      Result Value   Glucose, Bld 113 (*)    All other components within normal limits  CBC WITH DIFFERENTIAL/PLATELET - Abnormal; Notable for the following components:   RBC 6.13 (*)    Hemoglobin 17.6 (*)    All other components within normal limits  PROTIME-INR - Abnormal; Notable for the following components:   Prothrombin Time 30.8 (*)    All other components within normal limits  TROPONIN I  TSH    ____________________________________________    EKG I, Lisa Roca, MD, the attending physician have personally viewed and interpreted all ECGs.  123 bpm.  Atrial flutter with rapid ventricular response.  Left axis deviation.  ST segment depressions inferiorly and laterally. ____________________________________________  RADIOLOGY   Chest x-ray portable:  IMPRESSION: New prominence in the region of the ascending thoracic aorta. Question dilatation of the ascending aorta versus potential adenopathy on the right side. Given apparent change from the 2018 study, chest CT, ideally with intravenous contrast, is advised to further  evaluate.  No edema or consolidation. Stable previous median sternotomy. Aortic atherosclerosis noted.  Aortic Atherosclerosis (ICD10-I70.0). __________________________________________  PROCEDURES  Procedure(s) performed: None  Procedures  Critical Care performed: CRITICAL CARE Performed by: Lisa Roca   Total critical care time: 30 minutes  Critical care time was exclusive of separately billable procedures and treating other patients.  Critical care was necessary to treat or prevent imminent or life-threatening deterioration.  Critical care was time spent personally by me on the following activities: development of treatment plan with patient and/or surrogate as well as nursing, discussions with consultants, evaluation of patient's response to treatment, examination of patient, obtaining history from patient or surrogate, ordering and performing treatments and interventions, ordering and review of laboratory studies, ordering and review of radiographic studies, pulse oximetry and re-evaluation of patient's condition.    ____________________________________________  ED COURSE / ASSESSMENT  AND PLAN  Pertinent labs & imaging results that were available during my care of the patient were reviewed by me and considered in my medical decision making (see chart for details).    Patient has a history of prior atrial fibrillation and is in rapid atrial flutter here today.  Sounds like this may be because he is been on subtherapeutic dosing of metoprolol having discontinued the evening dose.  At rest he has no symptoms at all.  Initially given IV dose of metoprolol, 5 mg, no significant improvement or change in his heart rate.  We will placed on diltiazem drip and discussed with hospitalist for admission.  Laboratory studies are reassuring with a normal troponin, no anemia, electrolytes are okay.  Patient also did receive an IV bolus of fluids.   Chest x-ray noted by  radiologist to question dilation of the ascending aorta, recommend a chest CT.  I will go ahead and place this order.  Pending for hospitalist admission.  CONSULTATIONS: Hospitalist for admission.  I spoke with Dr. Saralyn Pilar by phone, now the patient is rate controlled and off the diltiazem drip, will place patient back on previous metoprolol tartrate twice daily.  Patient would really like to go home and he will call Dr. Bethanne Ginger office tomorrow for an appointment this week.   Patient / Family / Caregiver informed of clinical course, medical decision-making process, and agree with plan.  CT was obtained given aortic notch enlargement and is consistent with sending aortic aneurysm without dissection.  I did discuss this with patient and daughter as well as give him a copy of the results indicating patient will need referral to follow-up with cardiothoracic surgeon as well as semiannual imaging.  We discussed return precautions.      Discharge instructions: You are given medication to help control racing heartbeat.  You are currently in atrial fibrillation but rate controlled.  Please restart metoprolol 25 mg twice daily as prescribed previously.  As we discussed and you have a copy of the report, your CT scan of the chest does show an aneurysm of the ascending aorta, and you need to follow-up with a cardiothoracic surgeon that can be referred by your primary care doctor Dr. Caryl Comes or Dr. Ubaldo Glassing.  Return to the emergency room immediately for any worsening condition including chest pain, shortness of breath, trouble breathing, dizziness or passing out, or any other symptoms concerning to you.  ___________________________________________   FINAL CLINICAL IMPRESSION(S) / ED DIAGNOSES   Final diagnoses:  Ascending aortic aneurysm ([facility])  Atrial flutter with rapid ventricular response ([place])      ___________________________________________         Note: This dictation was prepared  with Dragon dictation. Any transcriptional errors that result from this process are unintentional    Lisa Roca, MD 04/06/18 [place]    Lisa Roca, MD 04/06/18 1531    Lisa Roca, MD 04/06/18 1538

## 2018-04-06 NOTE — ED Triage Notes (Signed)
PT arrives with concerns over tachycardia and shortness of breath. Pt in afib in triage with a rate of 122.

## 2018-04-07 ENCOUNTER — Observation Stay
Admit: 2018-04-07 | Discharge: 2018-04-07 | Disposition: A | Discharge status: Home / Self Care | Payer: PRIVATE HEALTH INSURANCE | Attending: Physician Assistant | Admitting: Physician Assistant

## 2018-04-07 LAB — BASIC METABOLIC PANEL
ANION GAP: 8 (ref 5–15)
BUN: 17 mg/dL (ref 6–20)
CALCIUM: 8.5 mg/dL — AB (ref 8.9–10.3)
CO2: 23 mmol/L (ref 22–32)
Chloride: 110 mmol/L (ref 98–111)
Creatinine, Ser: 1.08 mg/dL (ref 0.61–1.24)
GFR calc Af Amer: >60 mL/min (ref >60)
Glucose, Bld: 86 mg/dL (ref 70–99)
POTASSIUM: 4.2 mmol/L (ref 3.5–5.1)
Sodium: 141 mmol/L (ref 135–145)

## 2018-04-07 LAB — CBC
HEMATOCRIT: 43.2 % (ref 39.0–52.0)
Hemoglobin: 14.5 g/dL (ref 13.0–17.0)
MCH: 28.5 pg (ref 26.0–34.0)
MCHC: 33.6 g/dL (ref 30.0–36.0)
MCV: 85 fL (ref 80.0–100.0)
PLATELETS: 216 10*3/uL (ref 150–400)
RBC: 5.08 MIL/uL (ref 4.22–5.81)
RDW: 13.6 % (ref 11.5–15.5)
WBC: 7.2 10*3/uL (ref 4.0–10.5)
nRBC: 0 % (ref 0.0–0.2)

## 2018-04-07 LAB — ECHOCARDIOGRAM COMPLETE
HEIGHTINCHES: 72 in
Weight: 2560 oz

## 2018-04-07 LAB — PROTIME-INR
INR: 3.22
Prothrombin Time: 32.4 seconds — ABNORMAL HIGH (ref 11.4–15.2)

## 2018-04-07 LAB — TROPONIN I

## 2018-04-07 MED ORDER — WARFARIN SODIUM 6 MG PO TABS
9.0000 mg | ORAL_TABLET | Freq: Every day | ORAL | Status: DC
  Filled 2018-04-07: qty 1

## 2018-04-07 NOTE — Consult Note (Signed)
ANTICOAGULATION CONSULT NOTE - Initial Consult  Pharmacy Consult for Warfarin Indication: atrial fibrillation/mechanical valve  Allergies  Allergen Reactions  . Penicillins Rash    Patient Measurements: Height: 6' (182.9 cm) Weight: 160 lb (72.6 kg) IBW/kg (Calculated) : 77.6   Vital Signs: Temp: 98.3 F (36.8 C) (11/20 0417) Temp Source: Oral (11/20 0417) BP: 92/50 (11/20 0417) Pulse Rate: 59 (11/20 0417)  Labs: Recent Labs    04/06/18 1058 04/06/18 2226 04/07/18 0437  HGB 17.6*  --  14.5  HCT 51.2  --  43.2  PLT 312  --  216  LABPROT 30.8*  --  32.4*  INR 3.01  --  3.22  CREATININE 1.08  --  1.08  TROPONINI 0.03*  <0.03 0.03* <0.03    Estimated Creatinine Clearance: 74.7 mL/min (by C-G formula based on SCr of 1.08 mg/dL).   Medical History: Past Medical History:  Diagnosis Date  . Atrial fibrillation ([place])   . High cholesterol   . Status post mechanical aortic valve replacement     Medications:  Scheduled:  . aspirin EC  81 mg Oral Daily  . ezetimibe  10 mg Oral Daily  . Influenza vac split quadrivalent PF  0.5 mL Intramuscular Tomorrow-1000  . metoprolol tartrate  25 mg Oral BID  . sertraline  25 mg Oral Daily  . traZODone  50 mg Oral QHS  . warfarin  9 mg Oral q morning - 10a  . Warfarin - Pharmacist Dosing Inpatient   Does not apply q1800    Assessment: 62 yo male with atrial fibrillation, has been cardioverted twice. Currently in atrial flutter. Presented with tachycardia and SOB. Patient takes Warfarin 9 mg at home daily.  WARFARIN COURSE DATE INR DOSE  11/19 3.01   11/20 3.22  Goal of Therapy:  Mechanical valve and afib: 2.5-3.5 Monitor platelets by anticoagulation protocol: Yes   Plan:  INR therapeutic. Continue PTA dose warfarin 9 mg po daily. Will change time to q1800 per protocol.   Pharmacy will continue to monitor.  Laural Benes, PharmD, BCPS Clinical Pharmacist 04/07/2018 7:20 AM

## 2018-04-07 NOTE — Progress Notes (Signed)
*  PRELIMINARY RESULTS* Echocardiogram 2D Echocardiogram has been performed.  Sherrie Sport 04/07/2018, 3:13 PM

## 2018-04-07 NOTE — Consult Note (Signed)
[facility] Cardiology  CARDIOLOGY CONSULT NOTE  Patient ID: Tyler Valencia MRN: [id number] DOB/AGE: 1955-09-29 62 y.o.  Admit date: 04/06/2018 Referring Physician Posey Pronto Primary Physician Ramonita Lab Primary Cardiologist Fath Reason for Consultation Atrial fibrillation with RVR  HPI: 62 year old male referred for evaluation of atrial fibrillation with rapid ventricular rate.  The patient has a history of paroxysmal atrial fibrillation on warfarin, bicuspid aortic valve, status post mechanical aortic valve replacement in 1995, sleep apnea, tobacco abuse, and hyperlipidemia.  The patient presented to [facility] ER yesterday feeling chest tightness with associated shortness of breath and lightheadedness that morning, which felt similar to previous episodes of atrial fibrillation. In the ER, ECG revealed atrial flutter at a rate of 123 bpm with ST depression inferiorly and laterally. He was started on Cardizem drip, which improved his rate, though his blood pressure dropped to 87 systolic, and his rate dropped to the 50s, thus the patient was admitted.  Of note, the patient was only taking his metoprolol tartrate once per day for the last several months.  Admission labs notable for therapeutic INR of 3.01. Troponin 0.03, 0.03, followed by less than 0.03. Chest CT revealed ascending aortic aneurysm without dissection. Currently, the patient reports feeling fine.  He denies any recent episodes of palpitations.  He denies chest pain or shortness of breath.      Review of systems complete and found to be negative unless listed above     Past Medical History:  Diagnosis Date  . Atrial fibrillation ([place])   . High cholesterol   . Status post mechanical aortic valve replacement     Past Surgical History:  Procedure Laterality Date  . CARDIAC SURGERY  1996   aortic valve    Medications Prior to Admission  Medication Sig Dispense Refill Last Dose  . aspirin EC 81 MG tablet Take 81 mg by mouth daily.    04/06/2018 at 0800  . ezetimibe (ZETIA) 10 MG tablet Take 10 mg by mouth daily.   04/05/2018 at 2000  . metoprolol tartrate (LOPRESSOR) 25 MG tablet Take 25 mg by mouth 2 (two) times daily.   04/06/2018 at 0800  . sertraline (ZOLOFT) 25 MG tablet Take 25 mg by mouth daily.   04/05/2018 at 2000  . traZODone (DESYREL) 50 MG tablet Take 50 mg by mouth at bedtime.   04/05/2018 at 2000  . warfarin (COUMADIN) 4 MG tablet Take 4 mg by mouth daily. Take in addition to 5mg  for a total of 9mg  daily   04/06/2018 at 0800  . warfarin (COUMADIN) 5 MG tablet Take 5 mg by mouth daily. Take in addition to 4mg  for a total of 9mg  daily   04/06/2018 at 0800   Social History   Socioeconomic History  . Marital status: Married    Spouse name: Not on file  . Number of children: Not on file  . Years of education: Not on file  . Highest education level: Not on file  Occupational History  . Not on file  Social Needs  . Financial resource strain: Not on file  . Food insecurity:    Worry: Not on file    Inability: Not on file  . Transportation needs:    Medical: Not on file    Non-medical: Not on file  Tobacco Use  . Smoking status: Current Every Day Smoker    Packs/day: 0.50  . Smokeless tobacco: Never Used  Substance and Sexual Activity  . Alcohol use: Not on file  . Drug use:  Not on file  . Sexual activity: Not on file  Lifestyle  . Physical activity:    Days per week: Not on file    Minutes per session: Not on file  . Stress: Not on file  Relationships  . Social connections:    Talks on phone: Not on file    Gets together: Not on file    Attends religious service: Not on file    Active member of club or organization: Not on file    Attends meetings of clubs or organizations: Not on file    Relationship status: Not on file  . Intimate partner violence:    Fear of current or ex partner: Not on file    Emotionally abused: Not on file    Physically abused: Not on file    Forced sexual activity:  Not on file  Other Topics Concern  . Not on file  Social History Narrative  . Not on file    Family History  Problem Relation Age of Onset  . CAD Mother       Review of systems complete and found to be negative unless listed above      PHYSICAL EXAM  General: Well developed, well nourished, in no acute distress HEENT:  Normocephalic and atramatic Neck:  No JVD.  Lungs: Clear bilaterally to auscultation, normal effort of breathing on room air Heart: Irregularly irregular.  Crisp mechanical heart sounds Abdomen: Nondistended Msk:  Back normal, gait not assessed . Normal strength and tone for age. Extremities: No clubbing, cyanosis or edema.   Neuro: Alert and oriented X 3. Psych:  Good affect, responds appropriately  Labs:   Lab Results  Component Value Date   WBC 7.2 04/07/2018   HGB 14.5 04/07/2018   HCT 43.2 04/07/2018   MCV 85.0 04/07/2018   PLT 216 04/07/2018    Recent Labs  Lab 04/07/18 0437  NA 141  K 4.2  CL 110  CO2 23  BUN 17  CREATININE 1.08  CALCIUM 8.5*  GLUCOSE 86   Lab Results  Component Value Date   CKTOTAL 209 08/10/2014   CKMB 1.1 08/10/2014   TROPONINI <0.03 04/07/2018   No results found for: CHOL No results found for: HDL No results found for: LDLCALC No results found for: TRIG No results found for: CHOLHDL No results found for: LDLDIRECT    Radiology: Dg Chest Port 1 View  Result Date: 04/06/2018 CLINICAL DATA:  Tachycardia and shortness of breath EXAM: PORTABLE CHEST 1 VIEW COMPARISON:  August 04, 2016 FINDINGS: There is no edema or consolidation. The heart size is normal. Pulmonary vascularity appears normal. There is soft tissue prominence overlying the right hilum potentially arising from the ascending aorta. There is aortic atherosclerosis. Patient is status post median sternotomy. There is no evident adenopathy. No bone lesions. IMPRESSION: New prominence in the region of the ascending thoracic aorta. Question dilatation of  the ascending aorta versus potential adenopathy on the right side. Given apparent change from the 2018 study, chest CT, ideally with intravenous contrast, is advised to further evaluate. No edema or consolidation. Stable previous median sternotomy. Aortic atherosclerosis noted. Aortic Atherosclerosis (ICD10-I70.0). Electronically Signed   By: Lowella Grip III M.D.   On: 04/06/2018 14:00   Ct Angio Chest Aorta W And/or Wo Contrast  Result Date: 04/06/2018 CLINICAL DATA:  Chest pain.  History of atrial fibrillation. EXAM: CT ANGIOGRAPHY CHEST WITH CONTRAST TECHNIQUE: Initially, axial CT images were obtained through the chest without intravenous contrast material.  Multidetector CT imaging of the chest was performed using the standard protocol during bolus administration of intravenous contrast. Multiplanar CT image reconstructions and MIPs were obtained to evaluate the vascular anatomy. CONTRAST:  89mL OMNIPAQUE IOHEXOL 350 MG/ML SOLN COMPARISON:  Chest radiograph April 06, 2018 FINDINGS: Cardiovascular: No intramural hematoma is evident on the precontrast study. Patient is status post aortic valve replacement. There is aneurysmal dilatation of the ascending thoracic aorta measuring 4.9 x 4.8 cm. No dissection evident. Visualized great vessels appear normal except for minimal calcification at the origin of the left subclavian artery. There is aortic atherosclerosis. There are occasional foci of coronary artery calcification. No demonstrable pulmonary embolus. No evident pericardial effusion or pericardial thickening. Mediastinum/Nodes: Thyroid appears unremarkable. There are scattered subcentimeter mediastinal lymph nodes. There is a right hilar lymph node measuring 1.3 x 1.2 cm. No other lymph nodes meeting size criteria for adenopathy evident. No esophageal lesions are evident. Lungs/Pleura: There is bullous disease in each upper lobe near the apex, more on the right than on the left. There is apical  regions scarring bilaterally. Bullae are seen medially throughout the right upper lobe region. There are areas of mild atelectatic change. There is no edema or consolidation. There is a slight degree of proximal lower lobe bronchiectatic change. No pleural effusion or pleural thickening evident. Upper Abdomen: There is hepatic steatosis. Visualized upper abdominal structures otherwise appear unremarkable. Musculoskeletal: The patient is status post median sternotomy. No blastic or lytic bone lesions. No evident chest wall lesions. Review of the MIP images confirms the above findings. IMPRESSION: 1. Ascending thoracic aorta measures 4.9 x 4.8 cm. Ascending thoracic aortic aneurysm. Recommend semi-annual imaging followup by CTA or MRA and referral to cardiothoracic surgery if not already obtained. This recommendation follows 2010 ACCF/AHA/AATS/ACR/ASA/SCA/SCAI/SIR/STS/SVM Guidelines for the Diagnosis and Management of Patients With Thoracic Aortic Disease. Circulation. 2010; 121: T557-D220. No thoracic aortic dissection. There are foci of aortic atherosclerosis as well as foci of coronary artery calcification. No demonstrable pulmonary embolus. 2. 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 Areas of mild atelectatic change. No edema or consolidation. Scarring in both apices noted. Mild proximal lower lobe bronchiectatic change bilaterally. 3.  Mildly prominent right hilar lymph node of uncertain etiology. 4.  Status post aortic valve replacement. 5.  Hepatic steatosis. Comment: The prominence on the right on recent chest radiograph is due to aneurysmal dilatation of the ascending thoracic aorta. Aortic Atherosclerosis (ICD10-I70.0). Electronically Signed   By: Lowella Grip III M.D.   On: 04/06/2018 15:00    EKG: Atrial fibrillation, rate 67-80 bpm  ASSESSMENT AND PLAN:  1.  Atrial fibrillation with rapid ventricular rate with a history of paroxysmal atrial  fibrillation, likely secondary to only taking his metoprolol tartrate once daily for several months.  The patient was started on Cardizem drip with improvement in his rate, which was discontinued, then his blood pressure and heart rate dropped.  His heart rate is now in the high 60s to 80s, and the patient reports feeling well. 2.  History of bicuspid aortic valve, status post mechanical aortic valve replacement in 1995, therapeutic INR on warfarin 3. Sleep apnea, inconsistent use of CPAP 4. Tobacco abuse, does not wish to quit  Recommendations: 1.  Restart metoprolol tartrate 25 mg twice daily with careful monitoring of blood pressure and heart rate 2.  2D echocardiogram 3.  Continue warfarin with goal INR 2.5-3.5 4.  Encourage patient to stop smoking 5.  Encourage  patient to be compliant with his CPAP 6.  Further recommendations pending patient's initial course  Signed: Clabe Seal PA-C 04/07/2018, 8:02 AM

## 2018-04-07 NOTE — Discharge Summary (Signed)
[facility] [place] NAME: Tyler Valencia    MR#:  [id number]  DATE OF BIRTH:  [birth year]  DATE OF ADMISSION:  04/06/2018 ADMITTING PHYSICIAN: Vaughan Basta, MD  DATE OF DISCHARGE: 04/07/2018  PRIMARY CARE PHYSICIAN: Tama High III, MD    ADMISSION DIAGNOSIS:  Ascending aortic aneurysm ([place]) [I71.2] Atrial flutter with rapid ventricular response ([place]) [I48.92] Atrial fibrillation with RVR ([place]) [I48.91]  DISCHARGE DIAGNOSIS:  Afib with RVR  SECONDARY DIAGNOSIS:   Past Medical History:  Diagnosis Date  . Atrial fibrillation ([place])   . High cholesterol   . Status post mechanical aortic valve replacement     HOSPITAL COURSE:  Tyler Valencia  is a 62 y.o. male with a known history of A. fib, high cholesterol, status post mechanical aortic valve-on Coumadin at home. He was advised by Dr. Ubaldo Glassing to have metoprolol tablet twice a day.  Last 5 to 43-month he is only taking it once a day in the morning without informing his cardiologist, as he was feeling fine with just one time dose.  *Atrial fibrillation with RVR IV Cardizem drip as started by ER. Metoprolol oral continue 25 mg bid. Patient tolerating it well. Denies any dizziness. Heart rate 57 to 60. -Appreciate cardiology input. Recommends continue current dose of metoprolol and warfarin and follow-up with Dr. Ubaldo Glassing as outpatient.  Patient is already on Coumadin and therapeutic INR.   * mechanical aortic valve   INR is 3, cont coumadin  * Hypercholesterolemia   Cont Zetia.  *Active smoking Counseled to quit smoking for 4 minutes and offered nicotine patch.  *Incidental finding of ascending aortic aneurysm. Will schedule patient to follow up with vascular as outpatient.  Overall feels back to baseline. He is okay to discharge. He is otherwise stable. CONSULTS OBTAINED:  Treatment Team:  Isaias Cowman, MD  DRUG ALLERGIES:   Allergies  Allergen Reactions   . Penicillins Rash    DISCHARGE MEDICATIONS:   Allergies as of 04/07/2018      Reactions   Penicillins Rash      Medication List    TAKE these medications   aspirin EC 81 MG tablet Take 81 mg by mouth daily.   ezetimibe 10 MG tablet Commonly known as:  ZETIA Take 10 mg by mouth daily.   metoprolol tartrate 25 MG tablet Commonly known as:  LOPRESSOR Take 25 mg by mouth 2 (two) times daily.   sertraline 25 MG tablet Commonly known as:  ZOLOFT Take 25 mg by mouth daily.   traZODone 50 MG tablet Commonly known as:  DESYREL Take 50 mg by mouth at bedtime.   warfarin 5 MG tablet Commonly known as:  COUMADIN Take 5 mg by mouth daily. Take in addition to 4mg  for a total of 9mg  daily   warfarin 4 MG tablet Commonly known as:  COUMADIN Take 4 mg by mouth daily. Take in addition to 5mg  for a total of 9mg  daily       If you experience worsening of your admission symptoms, develop shortness of breath, life threatening emergency, suicidal or homicidal thoughts you must seek medical attention immediately by calling 911 or calling your MD immediately  if symptoms less severe.  You Must read complete instructions/literature along with all the possible adverse reactions/side effects for all the Medicines you take and that have been prescribed to you. Take any new Medicines after you have completely understood and accept all the possible adverse reactions/side effects.  Please note  You were cared for by a hospitalist during your hospital stay. If you have any questions about your discharge medications or the care you received while you were in the hospital after you are discharged, you can call the unit and asked to speak with the hospitalist on call if the hospitalist that took care of you is not available. Once you are discharged, your primary care physician will handle any further medical issues. Please note that NO REFILLS for any discharge medications will be authorized once  you are discharged, as it is imperative that you return to your primary care physician (or establish a relationship with a primary care physician if you do not have one) for your aftercare needs so that they can reassess your need for medications and monitor your lab values. Today   SUBJECTIVE   New complaints. No chest pain or shortness of breath.  VITAL SIGNS:  Blood pressure 108/65, pulse (!) 53, temperature 98.4 F (36.9 C), temperature source Oral, resp. rate 18, height 6' (1.829 m), weight 72.6 kg, SpO2 94 %.  I/O:    Intake/Output Summary (Last 24 hours) at 04/07/2018 1336 Last data filed at 04/07/2018 1018 Gross per 24 hour  Intake 366.22 ml  Output 0 ml  Net 366.22 ml    PHYSICAL EXAMINATION:  GENERAL:  62 y.o.-year-old patient lying in the bed with no acute distress.  EYES: Pupils equal, round, reactive to light and accommodation. No scleral icterus. Extraocular muscles intact.  HEENT: Head atraumatic, normocephalic. Oropharynx and nasopharynx clear.  NECK:  Supple, no jugular venous distention. No thyroid enlargement, no tenderness.  LUNGS: Normal breath sounds bilaterally, no wheezing, rales,rhonchi or crepitation. No use of accessory muscles of respiration.  CARDIOVASCULAR: S1, S2 normal. No murmurs, rubs, or gallops.  ABDOMEN: Soft, non-tender, non-distended. Bowel sounds present. No organomegaly or mass.  EXTREMITIES: No pedal edema, cyanosis, or clubbing.  NEUROLOGIC: Cranial nerves II through XII are intact. Muscle strength 5/5 in all extremities. Sensation intact. Gait not checked.  PSYCHIATRIC: The patient is alert and oriented x 3.  SKIN: No obvious rash, lesion, or ulcer.   DATA REVIEW:   CBC  Recent Labs  Lab 04/07/18 0437  WBC 7.2  HGB 14.5  HCT 43.2  PLT 216    Chemistries  Recent Labs  Lab 04/07/18 0437  NA 141  K 4.2  CL 110  CO2 23  GLUCOSE 86  BUN 17  CREATININE 1.08  CALCIUM 8.5*    Microbiology Results   No results found for  this or any previous visit (from the past 240 hour(s)).  RADIOLOGY:  Dg Chest Port 1 View  Result Date: 04/06/2018 CLINICAL DATA:  Tachycardia and shortness of breath EXAM: PORTABLE CHEST 1 VIEW COMPARISON:  August 04, 2016 FINDINGS: There is no edema or consolidation. The heart size is normal. Pulmonary vascularity appears normal. There is soft tissue prominence overlying the right hilum potentially arising from the ascending aorta. There is aortic atherosclerosis. Patient is status post median sternotomy. There is no evident adenopathy. No bone lesions. IMPRESSION: New prominence in the region of the ascending thoracic aorta. Question dilatation of the ascending aorta versus potential adenopathy on the right side. Given apparent change from the 2018 study, chest CT, ideally with intravenous contrast, is advised to further evaluate. No edema or consolidation. Stable previous median sternotomy. Aortic atherosclerosis noted. Aortic Atherosclerosis (ICD10-I70.0). Electronically Signed   By: Lowella Grip III M.D.   On: 04/06/2018 14:00   Ct Angio  Chest Aorta W And/or Wo Contrast  Result Date: 04/06/2018 CLINICAL DATA:  Chest pain.  History of atrial fibrillation. EXAM: CT ANGIOGRAPHY CHEST WITH CONTRAST TECHNIQUE: Initially, axial CT images were obtained through the chest without intravenous contrast material. Multidetector CT imaging of the chest was performed using the standard protocol during bolus administration of intravenous contrast. Multiplanar CT image reconstructions and MIPs were obtained to evaluate the vascular anatomy. CONTRAST:  64mL OMNIPAQUE IOHEXOL 350 MG/ML SOLN COMPARISON:  Chest radiograph April 06, 2018 FINDINGS: Cardiovascular: No intramural hematoma is evident on the precontrast study. Patient is status post aortic valve replacement. There is aneurysmal dilatation of the ascending thoracic aorta measuring 4.9 x 4.8 cm. No dissection evident. Visualized great vessels appear  normal except for minimal calcification at the origin of the left subclavian artery. There is aortic atherosclerosis. There are occasional foci of coronary artery calcification. No demonstrable pulmonary embolus. No evident pericardial effusion or pericardial thickening. Mediastinum/Nodes: Thyroid appears unremarkable. There are scattered subcentimeter mediastinal lymph nodes. There is a right hilar lymph node measuring 1.3 x 1.2 cm. No other lymph nodes meeting size criteria for adenopathy evident. No esophageal lesions are evident. Lungs/Pleura: There is bullous disease in each upper lobe near the apex, more on the right than on the left. There is apical regions scarring bilaterally. Bullae are seen medially throughout the right upper lobe region. There are areas of mild atelectatic change. There is no edema or consolidation. There is a slight degree of proximal lower lobe bronchiectatic change. No pleural effusion or pleural thickening evident. Upper Abdomen: There is hepatic steatosis. Visualized upper abdominal structures otherwise appear unremarkable. Musculoskeletal: The patient is status post median sternotomy. No blastic or lytic bone lesions. No evident chest wall lesions. Review of the MIP images confirms the above findings. IMPRESSION: 1. Ascending thoracic aorta measures 4.9 x 4.8 cm. Ascending thoracic aortic aneurysm. Recommend semi-annual imaging followup by CTA or MRA and referral to cardiothoracic surgery if not already obtained. This recommendation follows 2010 ACCF/AHA/AATS/ACR/ASA/SCA/SCAI/SIR/STS/SVM Guidelines for the Diagnosis and Management of Patients With Thoracic Aortic Disease. Circulation. 2010; 121: E993-Z169. No thoracic aortic dissection. There are foci of aortic atherosclerosis as well as foci of coronary artery calcification. No demonstrable pulmonary embolus. 2. 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 Areas of mild atelectatic change.  No edema or consolidation. Scarring in both apices noted. Mild proximal lower lobe bronchiectatic change bilaterally. 3.  Mildly prominent right hilar lymph node of uncertain etiology. 4.  Status post aortic valve replacement. 5.  Hepatic steatosis. Comment: The prominence on the right on recent chest radiograph is due to aneurysmal dilatation of the ascending thoracic aorta. Aortic Atherosclerosis (ICD10-I70.0). Electronically Signed   By: Lowella Grip III M.D.   On: 04/06/2018 15:00     Management plans discussed with the patient, family and they are in agreement.  CODE STATUS:     Code Status Orders  (From admission, onward)         Start     Ordered   04/06/18 1741  Full code  Continuous     04/06/18 1741        Code Status History    This patient has a current code status but no historical code status.      TOTAL TIME TAKING CARE OF THIS PATIENT: 40 minutes.    Fritzi Mandes M.D on 04/07/2018 at 1:36 PM  Between 7am to 6pm - Pager - [phone] After 6pm  go to [email or web address] - password [email or web address] [facility]  Office  [phone]  CC: Primary care physician; Adin Hector, MD

## 2018-04-08 LAB — HIV ANTIBODY (ROUTINE TESTING W REFLEX): HIV Screen 4th Generation wRfx: NONREACTIVE

## 2018-04-13 ENCOUNTER — Telehealth: Payer: Self-pay | Admitting: *Deleted

## 2018-04-13 NOTE — Telephone Encounter (Signed)
Melissa called patient to set up appt for him. The hospitalist had recommended the patient to come as outpt to see Dr. Janese Banks and pt. Told melissa that he does not want to come here for treatment and he found another provider

## 2018-04-13 NOTE — Telephone Encounter (Signed)
-----   Message from Billey Co sent at 04/13/2018 12:59 PM EST ----- Regarding: RE: New Patient Called pt to set up appt he states that he does not want to come here for treatment, that he have found another Dr. ----- Message ----- From: Sindy Guadeloupe, MD Sent: 04/08/2018   8:09 AM EST To: Billey Co Subject: RE: New Patient                                Please call him next week and let him know that the doctor who took care of him in the hospital Dr. Marthann Schiller wanted em to see him for his high hemoglobin   ----- Message ----- From: Billey Co Sent: 04/07/2018   1:54 PM EST To: Luella Cook, RN, Festus Holts, # Subject: RE: New Patient                                Called pt to set up new heme appt, pt states that he does not know who Dr Janese Banks is and why he has to come to a hematology. Pt also still in hospital. Thanks ----- Message ----- From: Festus Holts Sent: 04/07/2018  10:49 AM EST To: Billey Co Subject: New Patient                                      ----- Message ----- From: Sindy Guadeloupe, MD Sent: 04/06/2018   3:15 PM EST To: Festus Holts  Please schedule this patient to see me in 3 weeks time. New heme patient referred from hospital Polycythemia.  Thanks, Presenter, broadcasting

## 2018-04-23 ENCOUNTER — Ambulatory Visit (INDEPENDENT_AMBULATORY_CARE_PROVIDER_SITE_OTHER): Payer: PRIVATE HEALTH INSURANCE | Admitting: Nurse Practitioner

## 2018-04-23 ENCOUNTER — Encounter (INDEPENDENT_AMBULATORY_CARE_PROVIDER_SITE_OTHER): Payer: Self-pay | Admitting: Nurse Practitioner

## 2018-04-23 VITALS — BP 111/66 | HR 48 | Resp 17 | Ht 72.0 in | Wt 197.0 lb

## 2018-04-23 DIAGNOSIS — Z720 Tobacco use: Secondary | ICD-10-CM

## 2018-04-23 DIAGNOSIS — I712 Thoracic aortic aneurysm, without rupture, unspecified: Secondary | ICD-10-CM

## 2018-04-23 DIAGNOSIS — E785 Hyperlipidemia, unspecified: Secondary | ICD-10-CM

## 2018-04-27 ENCOUNTER — Encounter (INDEPENDENT_AMBULATORY_CARE_PROVIDER_SITE_OTHER): Payer: Self-pay | Admitting: Nurse Practitioner

## 2018-04-27 DIAGNOSIS — Z720 Tobacco use: Secondary | ICD-10-CM | POA: Insufficient documentation

## 2018-04-27 DIAGNOSIS — E785 Hyperlipidemia, unspecified: Secondary | ICD-10-CM | POA: Insufficient documentation

## 2018-04-27 NOTE — Progress Notes (Signed)
Subjective:    Patient ID: Tyler Valencia, male    DOB: 04-07-1956, 62 y.o.   MRN: [id number] Chief Complaint  Patient presents with  . Follow-up    AAA finding on CT/2 week [facility] f/u    HPI  Enmanuel Zufall is a 62 y.o. male that has a history of a thoracic ascending aortic aneurysm.  Latest CT scan was done on 04/06/2018, which revealed a 4.9 x 4.8 cm aneurysm.  Patient denies abdominal pain or unusual back pain, no other abdominal complaints.  No history of an acute onset of painful blue discoloration of the toes.   Patient also denies any chest pain or shortness of breath.  Family history of AAA.   Patient denies amaurosis fugax or TIA symptoms. There is no history of claudication or rest pain symptoms of the lower extremities.  The patient denies angina or shortness of breath. Patient denies nausea, fever, vomiting, and diarrhea.  Past Medical History:  Diagnosis Date  . AAA (abdominal aortic aneurysm) ([place])   . Atrial fibrillation ([place])   . High cholesterol   . Status post mechanical aortic valve replacement     Past Surgical History:  Procedure Laterality Date  . CARDIAC SURGERY  1996   aortic valve    Social History   Socioeconomic History  . Marital status: Married    Spouse name: Not on file  . Number of children: Not on file  . Years of education: Not on file  . Highest education level: Not on file  Occupational History  . Not on file  Social Needs  . Financial resource strain: Not on file  . Food insecurity:    Worry: Not on file    Inability: Not on file  . Transportation needs:    Medical: Not on file    Non-medical: Not on file  Tobacco Use  . Smoking status: Current Every Day Smoker    Packs/day: 0.50  . Smokeless tobacco: Never Used  Substance and Sexual Activity  . Alcohol use: Not on file  . Drug use: Not on file  . Sexual activity: Not on file  Lifestyle  . Physical activity:    Days per week: Not on file    Minutes per  session: Not on file  . Stress: Not on file  Relationships  . Social connections:    Talks on phone: Not on file    Gets together: Not on file    Attends religious service: Not on file    Active member of club or organization: Not on file    Attends meetings of clubs or organizations: Not on file    Relationship status: Not on file  . Intimate partner violence:    Fear of current or ex partner: Not on file    Emotionally abused: Not on file    Physically abused: Not on file    Forced sexual activity: Not on file  Other Topics Concern  . Not on file  Social History Narrative  . Not on file    Family History  Problem Relation Age of Onset  . CAD Mother     Allergies  Allergen Reactions  . Lovastatin     Other reaction(s): Muscle Pain  . Rosuvastatin     Other reaction(s): Muscle Pain  . Penicillins Rash     Review of Systems   Review of Systems: Negative Unless Checked Constitutional: [] Weight loss  [] Fever  [] Chills Cardiac: [] Chest pain   []  Atrial  Fibrillation  [] Palpitations   [] Shortness of breath when laying flat   [] Shortness of breath with exertion. Vascular:  [] Pain in legs with walking   [] Pain in legs with standing  [] History of DVT   [] Phlebitis   [] Swelling in legs   [] Varicose veins   [] Non-healing ulcers Pulmonary:   [] Uses home oxygen   [] Productive cough   [] Hemoptysis   [] Wheeze  [] COPD   [] Asthma Neurologic:  [] Dizziness   [] Seizures   [] History of stroke   [] History of TIA  [] Aphasia   [] Vissual changes   [] Weakness or numbness in arm   [] Weakness or numbness in leg Musculoskeletal:   [] Joint swelling   [] Joint pain   [] Low back pain  []  History of Knee Replacement Hematologic:  [] Easy bruising  [] Easy bleeding   [] Hypercoagulable state   [] Anemic Gastrointestinal:  [] Diarrhea   [] Vomiting  [] Gastroesophageal reflux/heartburn   [] Difficulty swallowing. Genitourinary:  [] Chronic kidney disease   [] Difficult urination  [] Anuric   [] Blood in urine Skin:   [] Rashes   [] Ulcers  Psychological:  [] History of anxiety   []  History of major depression  []  Memory Difficulties     Objective:   Physical Exam  BP 111/66 (BP Location: Left Arm, Patient Position: Sitting)   Pulse (!) 48   Resp 17   Ht 6' (1.829 m)   Wt 197 lb (89.4 kg)   BMI 26.72 kg/m   Gen: WD/WN, NAD Head: [place]/AT, No temporalis wasting.  Ear/Nose/Throat: Hearing grossly intact, nares w/o erythema or drainage Eyes: PER, EOMI, sclera nonicteric.  Neck: Supple, no masses.  No JVD.  Pulmonary:  Good air movement, no use of accessory muscles.  Cardiac: RRR Vascular:  Vessel Right Left  Radial Palpable Palpable  Dorsalis Pedis Palpable Palpable  Posterior Tibial Palpable Palpable   Gastrointestinal: soft, non-distended. No guarding/no peritoneal signs.  Musculoskeletal: M/S 5/5 throughout.  No deformity or atrophy.  Neurologic: Pain and light touch intact in extremities.  Symmetrical.  Speech is fluent. Motor exam as listed above. Psychiatric: Judgment intact, Mood & affect appropriate for pt's clinical situation. Dermatologic: No Venous rashes. No Ulcers Noted.  No changes consistent with cellulitis. Lymph : No Cervical lymphadenopathy, no lichenification or skin changes of chronic lymphedema.      Assessment & Plan:   1. Thoracic aortic aneurysm without rupture [facility]) Patient had 4.9 x 4.8 cm ascending thoracic aortic aneurysm measured by CT scan.  We will continue to follow the patient until it reaches appropriate size for repair.  We will then refer to cardiothoracic surgeon.    We will have patient follow up in one year with CT Scan.  - CT ANGIO CHEST AORTA W/CM &/OR WO/CM; Future  2. Hyperlipidemia, unspecified hyperlipidemia type Continue statin as ordered and reviewed, no changes at this time   3. Tobacco abuse Smoking cessation was discussed, 3-10 minutes spent on this topic specifically    Current Outpatient Medications on File Prior to Visit  Medication  Sig Dispense Refill  . aspirin EC 81 MG tablet Take 81 mg by mouth daily.    Marland Kitchen ezetimibe (ZETIA) 10 MG tablet Take 10 mg by mouth daily.    . metoprolol tartrate (LOPRESSOR) 25 MG tablet Take 25 mg by mouth 2 (two) times daily.    . sertraline (ZOLOFT) 25 MG tablet Take 25 mg by mouth daily.    . traZODone (DESYREL) 50 MG tablet Take 50 mg by mouth at bedtime.    Marland Kitchen warfarin (COUMADIN) 4 MG tablet  Take 4 mg by mouth daily. Take in addition to 5mg  for a total of 9mg  daily    . warfarin (COUMADIN) 5 MG tablet Take 5 mg by mouth daily. Take in addition to 4mg  for a total of 9mg  daily    . varenicline (CHANTIX PAK) 0.5 MG X 11 & 1 MG X 42 tablet Follow package directions.     No current facility-administered medications on file prior to visit.     There are no Patient Instructions on file for this visit. No follow-ups on file.   Kris Hartmann, NP  This note was completed with Sales executive.  Any errors are purely unintentional.

## 2019-03-25 ENCOUNTER — Other Ambulatory Visit: Payer: Self-pay

## 2019-03-25 ENCOUNTER — Encounter: Payer: PRIVATE HEALTH INSURANCE | Attending: Cardiology | Admitting: *Deleted

## 2019-03-25 DIAGNOSIS — I4891 Unspecified atrial fibrillation: Secondary | ICD-10-CM | POA: Insufficient documentation

## 2019-03-25 DIAGNOSIS — Z9889 Other specified postprocedural states: Secondary | ICD-10-CM | POA: Insufficient documentation

## 2019-03-25 DIAGNOSIS — Z7982 Long term (current) use of aspirin: Secondary | ICD-10-CM | POA: Insufficient documentation

## 2019-03-25 DIAGNOSIS — F17200 Nicotine dependence, unspecified, uncomplicated: Secondary | ICD-10-CM | POA: Insufficient documentation

## 2019-03-25 DIAGNOSIS — E7800 Pure hypercholesterolemia, unspecified: Secondary | ICD-10-CM | POA: Insufficient documentation

## 2019-03-25 DIAGNOSIS — Z79899 Other long term (current) drug therapy: Secondary | ICD-10-CM | POA: Insufficient documentation

## 2019-03-25 DIAGNOSIS — I712 Thoracic aortic aneurysm, without rupture: Secondary | ICD-10-CM | POA: Insufficient documentation

## 2019-03-25 DIAGNOSIS — Z7901 Long term (current) use of anticoagulants: Secondary | ICD-10-CM | POA: Insufficient documentation

## 2019-03-25 DIAGNOSIS — Z8679 Personal history of other diseases of the circulatory system: Secondary | ICD-10-CM

## 2019-03-25 NOTE — Progress Notes (Signed)
Initial telephone orientation completed. Diagnosis can be found in CE 10/13. EP orientation scheduled for 11/9 at 8am

## 2019-03-28 ENCOUNTER — Encounter: Payer: PRIVATE HEALTH INSURANCE | Admitting: *Deleted

## 2019-03-28 ENCOUNTER — Other Ambulatory Visit: Payer: Self-pay

## 2019-03-28 VITALS — Ht 71.9 in | Wt 189.3 lb

## 2019-03-28 DIAGNOSIS — F17200 Nicotine dependence, unspecified, uncomplicated: Secondary | ICD-10-CM | POA: Diagnosis not present

## 2019-03-28 DIAGNOSIS — Z9889 Other specified postprocedural states: Secondary | ICD-10-CM | POA: Diagnosis present

## 2019-03-28 DIAGNOSIS — I712 Thoracic aortic aneurysm, without rupture: Secondary | ICD-10-CM | POA: Diagnosis not present

## 2019-03-28 DIAGNOSIS — E7800 Pure hypercholesterolemia, unspecified: Secondary | ICD-10-CM | POA: Diagnosis not present

## 2019-03-28 DIAGNOSIS — I4891 Unspecified atrial fibrillation: Secondary | ICD-10-CM | POA: Diagnosis not present

## 2019-03-28 DIAGNOSIS — Z8679 Personal history of other diseases of the circulatory system: Secondary | ICD-10-CM

## 2019-03-28 DIAGNOSIS — Z7901 Long term (current) use of anticoagulants: Secondary | ICD-10-CM | POA: Diagnosis not present

## 2019-03-28 DIAGNOSIS — Z79899 Other long term (current) drug therapy: Secondary | ICD-10-CM | POA: Diagnosis not present

## 2019-03-28 DIAGNOSIS — Z7982 Long term (current) use of aspirin: Secondary | ICD-10-CM | POA: Diagnosis not present

## 2019-03-28 NOTE — Progress Notes (Signed)
Cardiac Individual Treatment Plan  Patient Details  Name: Tyler Valencia MRN: [id number] Date of Birth: 1955-07-08 Referring Provider:     Cardiac Rehab from 03/28/2019 in [facility] Cardiac and Pulmonary Rehab  Referring Provider  Bartholome Bill MD      Initial Encounter Date:    Cardiac Rehab from 03/28/2019 in [facility] Cardiac and Pulmonary Rehab  Date  03/28/19      Visit Diagnosis: Status post ascending aortic aneurysm repair  Patient's Home Medications on Admission:  Current Outpatient Medications:  .  amiodarone (PACERONE) 200 MG tablet, Take by mouth., Disp: , Rfl:  .  aspirin EC 81 MG tablet, Take 81 mg by mouth daily., Disp: , Rfl:  .  ezetimibe (ZETIA) 10 MG tablet, Take 10 mg by mouth daily., Disp: , Rfl:  .  metoprolol tartrate (LOPRESSOR) 25 MG tablet, Take 25 mg by mouth 2 (two) times daily., Disp: , Rfl:  .  oxybutynin (DITROPAN) 5 MG tablet, Take by mouth., Disp: , Rfl:  .  sertraline (ZOLOFT) 25 MG tablet, Take 25 mg by mouth daily., Disp: , Rfl:  .  traZODone (DESYREL) 50 MG tablet, Take 50 mg by mouth at bedtime., Disp: , Rfl:  .  warfarin (COUMADIN) 4 MG tablet, Take 4 mg by mouth daily. Take in addition to 63m for a total of 941mdaily, Disp: , Rfl:  .  warfarin (COUMADIN) 5 MG tablet, Take 5 mg by mouth daily. Take in addition to 15m62mor a total of 9mg41mily, Disp: , Rfl:   Past Medical History: Past Medical History:  Diagnosis Date  . AAA (abdominal aortic aneurysm) (HCC)[place]. Atrial fibrillation (HCC)[place]. High cholesterol   . Status post mechanical aortic valve replacement     Tobacco Use: Social History   Tobacco Use  Smoking Status Current Every Day Smoker  . Packs/day: 0.50  Smokeless Tobacco Never Used    Labs: Recent Review Flowsheet Data    There is no flowsheet data to display.       Exercise Target Goals: Exercise Program Goal: Individual exercise prescription set using results from initial 6 min walk test and THRR while considering   patient's activity barriers and safety.   Exercise Prescription Goal: Initial exercise prescription builds to 30-45 minutes a day of aerobic activity, 2-3 days per week.  Home exercise guidelines will be given to patient during program as part of exercise prescription that the participant will acknowledge.  Activity Barriers & Risk Stratification: Activity Barriers & Cardiac Risk Stratification - 03/28/19 0911      Activity Barriers & Cardiac Risk Stratification   Activity Barriers  Deconditioning;Muscular Weakness;Balance Concerns    Cardiac Risk Stratification  High       6 Minute Walk: 6 Minute Walk    Row Name 03/28/19 0910         6 Minute Walk   Phase  Initial     Distance  1090 feet     Walk Time  6 minutes     # of Rest Breaks  0     MPH  3.23     METS  2.06     RPE  11     VO2 Peak  11.31     Symptoms  No     Resting HR  81 bpm     Resting BP  134/72     Resting Oxygen Saturation   97 %     Exercise Oxygen Saturation  during 6 min walk  98 %     Max Ex. HR  91 bpm     Max Ex. BP  146/74     2 Minute Post BP  104/66        Oxygen Initial Assessment:   Oxygen Re-Evaluation:   Oxygen Discharge (Final Oxygen Re-Evaluation):   Initial Exercise Prescription: Initial Exercise Prescription - 03/28/19 0900      Date of Initial Exercise RX and Referring Provider   Date  03/28/19    Referring Provider  Bartholome Bill MD      Treadmill   MPH  2    Grade  0.5    Minutes  15    METs  2.67      NuStep   Level  3    SPM  80    Minutes  15    METs  2      Arm Ergometer   Level  2    RPM  30    Minutes  15    METs  2      Recumbant Elliptical   Level  1    RPM  50    Minutes  15    METs  2      T5 Nustep   Level  3    SPM  80    Minutes  15    METs  2      Biostep-RELP   Level  3    SPM  50    Minutes  15    METs  2      Prescription Details   Frequency (times per week)  2    Duration  Progress to 30 minutes of continuous aerobic  without signs/symptoms of physical distress      Intensity   THRR 40-80% of Max Heartrate  112-143    Ratings of Perceived Exertion  11-13    Perceived Dyspnea  0-4      Progression   Progression  Continue to progress workloads to maintain intensity without signs/symptoms of physical distress.      Resistance Training   Training Prescription  Yes    Weight  3 lbs    Reps  10-15       Perform Capillary Blood Glucose checks as needed.  Exercise Prescription Changes: Exercise Prescription Changes    Row Name 03/28/19 0900             Response to Exercise   Blood Pressure (Admit)  134/72       Blood Pressure (Exercise)  146/74       Blood Pressure (Exit)  104/66       Heart Rate (Admit)  81 bpm       Heart Rate (Exercise)  91 bpm       Heart Rate (Exit)  89 bpm       Oxygen Saturation (Admit)  97 %       Oxygen Saturation (Exercise)  98 %       Rating of Perceived Exertion (Exercise)  11       Symptoms  none       Comments  walk test results          Exercise Comments:   Exercise Goals and Review: Exercise Goals    Row Name 03/28/19 0916             Exercise Goals   Increase Physical Activity  Yes  Intervention  Provide advice, education, support and counseling about physical activity/exercise needs.;Develop an individualized exercise prescription for aerobic and resistive training based on initial evaluation findings, risk stratification, comorbidities and participant's personal goals.       Expected Outcomes  Short Term: Attend rehab on a regular basis to increase amount of physical activity.;Long Term: Add in home exercise to make exercise part of routine and to increase amount of physical activity.;Long Term: Exercising regularly at least 3-5 days a week.       Increase Strength and Stamina  Yes       Intervention  Provide advice, education, support and counseling about physical activity/exercise needs.;Develop an individualized exercise prescription for  aerobic and resistive training based on initial evaluation findings, risk stratification, comorbidities and participant's personal goals.       Expected Outcomes  Short Term: Increase workloads from initial exercise prescription for resistance, speed, and METs.;Short Term: Perform resistance training exercises routinely during rehab and add in resistance training at home;Long Term: Improve cardiorespiratory fitness, muscular endurance and strength as measured by increased METs and functional capacity (6MWT)       Able to understand and use rate of perceived exertion (RPE) scale  Yes       Intervention  Provide education and explanation on how to use RPE scale       Expected Outcomes  Short Term: Able to use RPE daily in rehab to express subjective intensity level;Long Term:  Able to use RPE to guide intensity level when exercising independently       Knowledge and understanding of Target Heart Rate Range (THRR)  Yes       Intervention  Provide education and explanation of THRR including how the numbers were predicted and where they are located for reference       Expected Outcomes  Short Term: Able to state/look up THRR;Short Term: Able to use daily as guideline for intensity in rehab;Long Term: Able to use THRR to govern intensity when exercising independently       Able to check pulse independently  Yes       Intervention  Review the importance of being able to check your own pulse for safety during independent exercise;Provide education and demonstration on how to check pulse in carotid and radial arteries.       Expected Outcomes  Short Term: Able to explain why pulse checking is important during independent exercise;Long Term: Able to check pulse independently and accurately       Understanding of Exercise Prescription  Yes       Intervention  Provide education, explanation, and written materials on patient's individual exercise prescription       Expected Outcomes  Short Term: Able to explain  program exercise prescription;Long Term: Able to explain home exercise prescription to exercise independently          Exercise Goals Re-Evaluation :   Discharge Exercise Prescription (Final Exercise Prescription Changes): Exercise Prescription Changes - 03/28/19 0900      Response to Exercise   Blood Pressure (Admit)  134/72    Blood Pressure (Exercise)  146/74    Blood Pressure (Exit)  104/66    Heart Rate (Admit)  81 bpm    Heart Rate (Exercise)  91 bpm    Heart Rate (Exit)  89 bpm    Oxygen Saturation (Admit)  97 %    Oxygen Saturation (Exercise)  98 %    Rating of Perceived Exertion (Exercise)  11  Symptoms  none    Comments  walk test results       Nutrition:  Target Goals: Understanding of nutrition guidelines, daily intake of sodium <1553m, cholesterol <2069m calories 30% from fat and 7% or less from saturated fats, daily to have 5 or more servings of fruits and vegetables.  Biometrics: Pre Biometrics - 03/28/19 0917      Pre Biometrics   Height  5' 11.9" (1.826 m)    Weight  189 lb 4.8 oz (85.9 kg)    BMI (Calculated)  25.75    Single Leg Stand  13.25 seconds        Nutrition Therapy Plan and Nutrition Goals:   Nutrition Assessments: Nutrition Assessments - 03/28/19 0924      MEDFICTS Scores   Pre Score  103       Nutrition Goals Re-Evaluation:   Nutrition Goals Discharge (Final Nutrition Goals Re-Evaluation):   Psychosocial: Target Goals: Acknowledge presence or absence of significant depression and/or stress, maximize coping skills, provide positive support system. Participant is able to verbalize types and ability to use techniques and skills needed for reducing stress and depression.   Initial Review & Psychosocial Screening: Initial Psych Review & Screening - 03/25/19 1407      Initial Review   Current issues with  None Identified      Family Dynamics   Good Support System?  Yes      Barriers   Psychosocial barriers to  participate in program  There are no identifiable barriers or psychosocial needs.      Screening Interventions   Interventions  Encouraged to exercise;To provide support and resources with identified psychosocial needs    Expected Outcomes  Short Term goal: Utilizing psychosocial counselor, staff and physician to assist with identification of specific Stressors or current issues interfering with healing process. Setting desired goal for each stressor or current issue identified.;Long Term Goal: Stressors or current issues are controlled or eliminated.;Short Term goal: Identification and review with participant of any Quality of Life or Depression concerns found by scoring the questionnaire.;Long Term goal: The participant improves quality of Life and PHQ9 Scores as seen by post scores and/or verbalization of changes       Quality of Life Scores:  Quality of Life - 03/28/19 0923      Quality of Life   Select  Quality of Life      Quality of Life Scores   Health/Function Pre  22.33 %    Socioeconomic Pre  20.86 %    Psych/Spiritual Pre  21.57 %    Family Pre  24.9 %    GLOBAL Pre  22.25 %      Scores of 19 and below usually indicate a poorer quality of life in these areas.  A difference of  2-3 points is a clinically meaningful difference.  A difference of 2-3 points in the total score of the Quality of Life Index has been associated with significant improvement in overall quality of life, self-image, physical symptoms, and general health in studies assessing change in quality of life.  PHQ-9: Recent Review Flowsheet Data    [facility] 03/28/2019   Decreased Interest 1   Down, Depressed, Hopeless 0   PHQ - 2 Score 1   Altered sleeping 1   Tired, decreased energy 1   Change in appetite 0   Feeling bad or failure about yourself  1   Trouble concentrating 0   Moving slowly or fidgety/restless 1  Suicidal thoughts 0   PHQ-9 Score 5   Difficult doing work/chores Not  difficult at all     Interpretation of Total Score  Total Score Depression Severity:  1-4 = Minimal depression, 5-9 = Mild depression, 10-14 = Moderate depression, 15-19 = Moderately severe depression, 20-27 = Severe depression   Psychosocial Evaluation and Intervention: Psychosocial Evaluation - 03/25/19 1409      Psychosocial Evaluation & Interventions   Comments  Adam reports feeling pretty well after his surgery. His wife is supportive and he has been staying with her so he feels well taken care of. He is looking forward to coming to cardiac rehab to gain independence in exercise and education about a heart healthy lifestyle    Expected Outcomes  Short: attend Cardiac Rehab. Long: independence with self care habits.       Psychosocial Re-Evaluation:   Psychosocial Discharge (Final Psychosocial Re-Evaluation):   Vocational Rehabilitation: Provide vocational rehab assistance to qualifying candidates.   Vocational Rehab Evaluation & Intervention: Vocational Rehab - 03/25/19 1406      Initial Vocational Rehab Evaluation & Intervention   Assessment shows need for Vocational Rehabilitation  No       Education: Education Goals: Education classes will be provided on a variety of topics geared toward better understanding of heart health and risk factor modification. Participant will state understanding/return demonstration of topics presented as noted by education test scores.  Learning Barriers/Preferences: Learning Barriers/Preferences - 03/25/19 1406      Learning Barriers/Preferences   Learning Barriers  None    Learning Preferences  None       Education Topics:  AED/CPR: - Group verbal and written instruction with the use of models to demonstrate the basic use of the AED with the basic ABC's of resuscitation.   General Nutrition Guidelines/Fats and Fiber: -Group instruction provided by verbal, written material, models and posters to present the general guidelines  for heart healthy nutrition. Gives an explanation and review of dietary fats and fiber.   Controlling Sodium/Reading Food Labels: -Group verbal and written material supporting the discussion of sodium use in heart healthy nutrition. Review and explanation with models, verbal and written materials for utilization of the food label.   Exercise Physiology & General Exercise Guidelines: - Group verbal and written instruction with models to review the exercise physiology of the cardiovascular system and associated critical values. Provides general exercise guidelines with specific guidelines to those with heart or lung disease.    Aerobic Exercise & Resistance Training: - Gives group verbal and written instruction on the various components of exercise. Focuses on aerobic and resistive training programs and the benefits of this training and how to safely progress through these programs..   Flexibility, Balance, Mind/Body Relaxation: Provides group verbal/written instruction on the benefits of flexibility and balance training, including mind/body exercise modes such as yoga, pilates and tai chi.  Demonstration and skill practice provided.   Stress and Anxiety: - Provides group verbal and written instruction about the health risks of elevated stress and causes of high stress.  Discuss the correlation between heart/lung disease and anxiety and treatment options. Review healthy ways to manage with stress and anxiety.   Depression: - Provides group verbal and written instruction on the correlation between heart/lung disease and depressed mood, treatment options, and the stigmas associated with seeking treatment.   Anatomy & Physiology of the Heart: - Group verbal and written instruction and models provide basic cardiac anatomy and physiology, with the coronary electrical and arterial  systems. Review of Valvular disease and Heart Failure   Cardiac Procedures: - Group verbal and written  instruction to review commonly prescribed medications for heart disease. Reviews the medication, class of the drug, and side effects. Includes the steps to properly store meds and maintain the prescription regimen. (beta blockers and nitrates)   Cardiac Medications I: - Group verbal and written instruction to review commonly prescribed medications for heart disease. Reviews the medication, class of the drug, and side effects. Includes the steps to properly store meds and maintain the prescription regimen.   Cardiac Medications II: -Group verbal and written instruction to review commonly prescribed medications for heart disease. Reviews the medication, class of the drug, and side effects. (all other drug classes)    Go Sex-Intimacy & Heart Disease, Get SMART - Goal Setting: - Group verbal and written instruction through game format to discuss heart disease and the return to sexual intimacy. Provides group verbal and written material to discuss and apply goal setting through the application of the S.M.A.R.T. Method.   Other Matters of the Heart: - Provides group verbal, written materials and models to describe Stable Angina and Peripheral Artery. Includes description of the disease process and treatment options available to the cardiac patient.   Exercise & Equipment Safety: - Individual verbal instruction and demonstration of equipment use and safety with use of the equipment.   Cardiac Rehab from 03/28/2019 in [facility] Cardiac and Pulmonary Rehab  Date  03/28/19  Educator  [facility]  Instruction Review Code  1- Verbalizes Understanding      Infection Prevention: - Provides verbal and written material to individual with discussion of infection control including proper hand washing and proper equipment cleaning during exercise session.   Cardiac Rehab from 03/28/2019 in [facility] Cardiac and Pulmonary Rehab  Date  03/28/19  Educator  [facility]  Instruction Review Code  1- Verbalizes Understanding       Falls Prevention: - Provides verbal and written material to individual with discussion of falls prevention and safety.   Cardiac Rehab from 03/28/2019 in [facility] Cardiac and Pulmonary Rehab  Date  03/28/19  Educator  [facility]  Instruction Review Code  1- Verbalizes Understanding      Diabetes: - Individual verbal and written instruction to review signs/symptoms of diabetes, desired ranges of glucose level fasting, after meals and with exercise. Acknowledge that pre and post exercise glucose checks will be done for 3 sessions at entry of program.   Know Your Numbers and Risk Factors: -Group verbal and written instruction about important numbers in your health.  Discussion of what are risk factors and how they play a role in the disease process.  Review of Cholesterol, Blood Pressure, Diabetes, and BMI and the role they play in your overall health.   Sleep Hygiene: -Provides group verbal and written instruction about how sleep can affect your health.  Define sleep hygiene, discuss sleep cycles and impact of sleep habits. Review good sleep hygiene tips.    Other: -Provides group and verbal instruction on various topics (see comments)   Knowledge Questionnaire Score: Knowledge Questionnaire Score - 03/28/19 0924      Knowledge Questionnaire Score   Pre Score  24/26 Education: Nutrition and Exercise       Core Components/Risk Factors/Patient Goals at Admission: Personal Goals and Risk Factors at Admission - 03/28/19 0927      Core Components/Risk Factors/Patient Goals on Admission    Weight Management  Yes;Weight Loss    Intervention  Weight Management: Develop a  combined nutrition and exercise program designed to reach desired caloric intake, while maintaining appropriate intake of nutrient and fiber, sodium and fats, and appropriate energy expenditure required for the weight goal.;Weight Management: Provide education and appropriate resources to help participant work on and attain  dietary goals.    Admit Weight  189 lb 4.8 oz (85.9 kg)    Goal Weight: Short Term  184 lb (83.5 kg)    Goal Weight: Long Term  179 lb (81.2 kg)    Expected Outcomes  Short Term: Continue to assess and modify interventions until short term weight is achieved;Long Term: Adherence to nutrition and physical activity/exercise program aimed toward attainment of established weight goal;Weight Loss: Understanding of general recommendations for a balanced deficit meal plan, which promotes 1-2 lb weight loss per week and includes a negative energy balance of [id number] kcal/d;Understanding recommendations for meals to include 15-35% energy as protein, 25-35% energy from fat, 35-60% energy from carbohydrates, less than 248m of dietary cholesterol, 20-35 gm of total fiber daily;Understanding of distribution of calorie intake throughout the day with the consumption of 4-5 meals/snacks    Tobacco Cessation  Yes    Number of packs per day  0.5    Intervention  Assist the participant in steps to quit. Provide individualized education and counseling about committing to Tobacco Cessation, relapse prevention, and pharmacological support that can be provided by physician.;OAdvice worker assist with locating and accessing local/national Quit Smoking programs, and support quit date choice.    Expected Outcomes  Short Term: Will demonstrate readiness to quit, by selecting a quit date.;Short Term: Will quit all tobacco product use, adhering to prevention of relapse plan.;Long Term: Complete abstinence from all tobacco products for at least 12 months from quit date.    Lipids  Yes    Intervention  Provide education and support for participant on nutrition & aerobic/resistive exercise along with prescribed medications to achieve LDL <725m HDL >4033m   Expected Outcomes  Short Term: Participant states understanding of desired cholesterol values and is compliant with medications prescribed. Participant is following  exercise prescription and nutrition guidelines.;Long Term: Cholesterol controlled with medications as prescribed, with individualized exercise RX and with personalized nutrition plan. Value goals: LDL < 73m73mDL > 40 mg.       Core Components/Risk Factors/Patient Goals Review:    Core Components/Risk Factors/Patient Goals at Discharge (Final Review):    ITP Comments: ITP Comments    Row Name 03/25/19 1413 03/28/19 0909         ITP Comments  Initial telephone orientation completed. Diagnosis can be found in CE 10/13. EP orientation scheduled for 11/9 at 8am  Completed 6MWT and gym orientation.  Initial ITP created and sent for review to Dr. MarkEmily Filbertdical Director.         Comments: Initial ITP

## 2019-03-28 NOTE — Patient Instructions (Signed)
Patient Instructions  Patient Details  Name: Tyler Valencia MRN: [id number] Date of Birth: [birth year] Referring Provider:  Teodoro Spray, MD  Below are your personal goals for exercise, nutrition, and risk factors. Our goal is to help you stay on track towards obtaining and maintaining these goals. We will be discussing your progress on these goals with you throughout the program.  Initial Exercise Prescription: Initial Exercise Prescription - 03/28/19 0900      Date of Initial Exercise RX and Referring Provider   Date  03/28/19    Referring Provider  Bartholome Bill MD      Treadmill   MPH  2    Grade  0.5    Minutes  15    METs  2.67      NuStep   Level  3    SPM  80    Minutes  15    METs  2      Arm Ergometer   Level  2    RPM  30    Minutes  15    METs  2      Recumbant Elliptical   Level  1    RPM  50    Minutes  15    METs  2      T5 Nustep   Level  3    SPM  80    Minutes  15    METs  2      Biostep-RELP   Level  3    SPM  50    Minutes  15    METs  2      Prescription Details   Frequency (times per week)  2    Duration  Progress to 30 minutes of continuous aerobic without signs/symptoms of physical distress      Intensity   THRR 40-80% of Max Heartrate  112-143    Ratings of Perceived Exertion  11-13    Perceived Dyspnea  0-4      Progression   Progression  Continue to progress workloads to maintain intensity without signs/symptoms of physical distress.      Resistance Training   Training Prescription  Yes    Weight  3 lbs    Reps  10-15       Exercise Goals: Frequency: Be able to perform aerobic exercise two to three times per week in program working toward 2-5 days per week of home exercise.  Intensity: Work with a perceived exertion of 11 (fairly light) - 15 (hard) while following your exercise prescription.  We will make changes to your prescription with you as you progress through the program.   Duration: Be able to do 30  to 45 minutes of continuous aerobic exercise in addition to a 5 minute warm-up and a 5 minute cool-down routine.   Nutrition Goals: Your personal nutrition goals will be established when you do your nutrition analysis with the dietician.  The following are general nutrition guidelines to follow: Cholesterol < 200mg /day Sodium < 1500mg /day Fiber: Men over 50 yrs - 30 grams per day  Personal Goals: Personal Goals and Risk Factors at Admission - 03/28/19 0927      Core Components/Risk Factors/Patient Goals on Admission    Weight Management  Yes;Weight Loss    Intervention  Weight Management: Develop a combined nutrition and exercise program designed to reach desired caloric intake, while maintaining appropriate intake of nutrient and fiber, sodium and fats, and appropriate energy expenditure required for the  weight goal.;Weight Management: Provide education and appropriate resources to help participant work on and attain dietary goals.    Admit Weight  189 lb 4.8 oz (85.9 kg)    Goal Weight: Short Term  184 lb (83.5 kg)    Goal Weight: Long Term  179 lb (81.2 kg)    Expected Outcomes  Short Term: Continue to assess and modify interventions until short term weight is achieved;Long Term: Adherence to nutrition and physical activity/exercise program aimed toward attainment of established weight goal;Weight Loss: Understanding of general recommendations for a balanced deficit meal plan, which promotes 1-2 lb weight loss per week and includes a negative energy balance of [phone] kcal/d;Understanding recommendations for meals to include 15-35% energy as protein, 25-35% energy from fat, 35-60% energy from carbohydrates, less than 200mg  of dietary cholesterol, 20-35 gm of total fiber daily;Understanding of distribution of calorie intake throughout the day with the consumption of 4-5 meals/snacks    Tobacco Cessation  Yes    Number of packs per day  0.5    Intervention  Assist the participant in steps to  quit. Provide individualized education and counseling about committing to Tobacco Cessation, relapse prevention, and pharmacological support that can be provided by physician.;Advice worker, assist with locating and accessing local/national Quit Smoking programs, and support quit date choice.    Expected Outcomes  Short Term: Will demonstrate readiness to quit, by selecting a quit date.;Short Term: Will quit all tobacco product use, adhering to prevention of relapse plan.;Long Term: Complete abstinence from all tobacco products for at least 12 months from quit date.    Lipids  Yes    Intervention  Provide education and support for participant on nutrition & aerobic/resistive exercise along with prescribed medications to achieve LDL 70mg , HDL >40mg .    Expected Outcomes  Short Term: Participant states understanding of desired cholesterol values and is compliant with medications prescribed. Participant is following exercise prescription and nutrition guidelines.;Long Term: Cholesterol controlled with medications as prescribed, with individualized exercise RX and with personalized nutrition plan. Value goals: LDL < 70mg , HDL > 40 mg.       Tobacco Use Initial Evaluation: Social History   Tobacco Use  Smoking Status Current Every Day Smoker  . Packs/day: 0.50  Smokeless Tobacco Never Used    Exercise Goals and Review: Exercise Goals    Row Name 03/28/19 0916             Exercise Goals   Increase Physical Activity  Yes       Intervention  Provide advice, education, support and counseling about physical activity/exercise needs.;Develop an individualized exercise prescription for aerobic and resistive training based on initial evaluation findings, risk stratification, comorbidities and participant's personal goals.       Expected Outcomes  Short Term: Attend rehab on a regular basis to increase amount of physical activity.;Long Term: Add in home exercise to make exercise part of  routine and to increase amount of physical activity.;Long Term: Exercising regularly at least 3-5 days a week.       Increase Strength and Stamina  Yes       Intervention  Provide advice, education, support and counseling about physical activity/exercise needs.;Develop an individualized exercise prescription for aerobic and resistive training based on initial evaluation findings, risk stratification, comorbidities and participant's personal goals.       Expected Outcomes  Short Term: Increase workloads from initial exercise prescription for resistance, speed, and METs.;Short Term: Perform resistance training exercises routinely during rehab and add  in resistance training at home;Long Term: Improve cardiorespiratory fitness, muscular endurance and strength as measured by increased METs and functional capacity (6MWT)       Able to understand and use rate of perceived exertion (RPE) scale  Yes       Intervention  Provide education and explanation on how to use RPE scale       Expected Outcomes  Short Term: Able to use RPE daily in rehab to express subjective intensity level;Long Term:  Able to use RPE to guide intensity level when exercising independently       Knowledge and understanding of Target Heart Rate Range (THRR)  Yes       Intervention  Provide education and explanation of THRR including how the numbers were predicted and where they are located for reference       Expected Outcomes  Short Term: Able to state/look up THRR;Short Term: Able to use daily as guideline for intensity in rehab;Long Term: Able to use THRR to govern intensity when exercising independently       Able to check pulse independently  Yes       Intervention  Review the importance of being able to check your own pulse for safety during independent exercise;Provide education and demonstration on how to check pulse in carotid and radial arteries.       Expected Outcomes  Short Term: Able to explain why pulse checking is important  during independent exercise;Long Term: Able to check pulse independently and accurately       Understanding of Exercise Prescription  Yes       Intervention  Provide education, explanation, and written materials on patient's individual exercise prescription       Expected Outcomes  Short Term: Able to explain program exercise prescription;Long Term: Able to explain home exercise prescription to exercise independently          Copy of goals given to participant.

## 2019-03-30 ENCOUNTER — Encounter: Payer: PRIVATE HEALTH INSURANCE | Admitting: *Deleted

## 2019-03-30 ENCOUNTER — Other Ambulatory Visit: Payer: Self-pay

## 2019-03-30 DIAGNOSIS — Z9889 Other specified postprocedural states: Secondary | ICD-10-CM | POA: Diagnosis not present

## 2019-03-30 DIAGNOSIS — Z8679 Personal history of other diseases of the circulatory system: Secondary | ICD-10-CM

## 2019-03-30 NOTE — Progress Notes (Signed)
Daily Session Note  Patient Details  Name: Samuele Storey MRN: [id number] Date of Birth: 04-29-1956 Referring Provider:     Cardiac Rehab from 03/28/2019 in [facility] Cardiac and Pulmonary Rehab  Referring Provider  Bartholome Bill MD      Encounter Date: 03/30/2019  Check In: Session Check In - 03/30/19 1513      Check-In   Staff Present  Heath Lark, RN, BSN, CCRP;Birdella Sippel Sherryll Burger, RN BSN;Joseph Hood RCP,RRT,BSRT;Melissa [place] RDN, LDN    Virtual Visit  No    Medication changes reported      No    Fall or balance concerns reported     No    Warm-up and Cool-down  Performed on first and last piece of equipment    Resistance Training Performed  Yes    VAD Patient?  No    PAD/SET Patient?  No      Pain Assessment   Currently in Pain?  No/denies          Social History   Tobacco Use  Smoking Status Current Every Day Smoker  . Packs/day: 0.50  Smokeless Tobacco Never Used    Goals Met:  Exercise tolerated well Personal goals reviewed No report of cardiac concerns or symptoms  Goals Unmet:  Not Applicable  Comments: First full day of exercise!  Patient was oriented to gym and equipment including functions, settings, policies, and procedures.  Patient's individual exercise prescription and treatment plan were reviewed.  All starting workloads were established based on the results of the 6 minute walk test done at initial orientation visit.  The plan for exercise progression was also introduced and progression will be customized based on patient's performance and goals.    Dr. Emily Filbert is Medical Director for [facility] and [facility].

## 2019-04-04 ENCOUNTER — Other Ambulatory Visit: Payer: Self-pay

## 2019-04-04 ENCOUNTER — Encounter: Payer: PRIVATE HEALTH INSURANCE | Admitting: *Deleted

## 2019-04-04 DIAGNOSIS — Z8679 Personal history of other diseases of the circulatory system: Secondary | ICD-10-CM

## 2019-04-04 DIAGNOSIS — Z9889 Other specified postprocedural states: Secondary | ICD-10-CM | POA: Diagnosis not present

## 2019-04-04 NOTE — Progress Notes (Signed)
Daily Session Note  Patient Details  Name: Tyler Valencia MRN: [id number] Date of Birth: 01/15/1956 Referring Provider:     Cardiac Rehab from 03/28/2019 in [facility] Cardiac and Pulmonary Rehab  Referring Provider  Bartholome Bill MD      Encounter Date: 04/04/2019  Check In: Session Check In - 04/04/19 1539      Check-In   Supervising physician immediately available to respond to emergencies  See telemetry face sheet for immediately available ER MD    Location  [facility]-Cardiac & Pulmonary Rehab    Staff Present  Renita Papa, RN BSN;Jessica [place], MA, RCEP, CCRP, [place], BS, ACSM CEP, Exercise Physiologist;Joseph Ideal RCP,RRT,BSRT    Virtual Visit  No    Medication changes reported      No    Fall or balance concerns reported     No    Warm-up and Cool-down  Performed on first and last piece of equipment    Resistance Training Performed  Yes    VAD Patient?  No    PAD/SET Patient?  No      Pain Assessment   Currently in Pain?  No/denies          Social History   Tobacco Use  Smoking Status Current Every Day Smoker  . Packs/day: 0.50  Smokeless Tobacco Never Used    Goals Met:  Independence with exercise equipment Exercise tolerated well No report of cardiac concerns or symptoms Strength training completed today  Goals Unmet:  Not Applicable  Comments: Pt able to follow exercise prescription today without complaint.  Will continue to monitor for progression.    Dr. Emily Filbert is Medical Director for [place] and [facility].

## 2019-04-04 NOTE — Progress Notes (Signed)
Completed initial RD eval 

## 2019-04-06 ENCOUNTER — Encounter: Payer: PRIVATE HEALTH INSURANCE | Admitting: *Deleted

## 2019-04-06 ENCOUNTER — Other Ambulatory Visit: Payer: Self-pay

## 2019-04-06 DIAGNOSIS — Z9889 Other specified postprocedural states: Secondary | ICD-10-CM

## 2019-04-06 DIAGNOSIS — Z8679 Personal history of other diseases of the circulatory system: Secondary | ICD-10-CM

## 2019-04-06 NOTE — Progress Notes (Signed)
Daily Session Note  Patient Details  Name: Ermias Tomeo MRN: [id number] Date of Birth: Oct 07, 1955 Referring Provider:     Cardiac Rehab from 03/28/2019 in [facility] Cardiac and Pulmonary Rehab  Referring Provider  Bartholome Bill MD      Encounter Date: 04/06/2019  Check In: Session Check In - 04/06/19 1508      Check-In   Supervising physician immediately available to respond to emergencies  See telemetry face sheet for immediately available ER MD    Location  [facility]-Cardiac & Pulmonary Rehab    Staff Present  Renita Papa, RN Vickki Hearing, BA, ACSM CEP, Exercise Physiologist;Melissa Caiola RDN, LDN    Virtual Visit  No    Medication changes reported      No    Fall or balance concerns reported     No    Warm-up and Cool-down  Performed on first and last piece of equipment    Resistance Training Performed  Yes    VAD Patient?  No    PAD/SET Patient?  No      Pain Assessment   Currently in Pain?  No/denies          Social History   Tobacco Use  Smoking Status Current Every Day Smoker  . Packs/day: 0.50  Smokeless Tobacco Never Used    Goals Met:  Independence with exercise equipment Exercise tolerated well No report of cardiac concerns or symptoms Strength training completed today  Goals Unmet:  Not Applicable  Comments: Pt able to follow exercise prescription today without complaint.  Will continue to monitor for progression.    Dr. Emily Filbert is Medical Director for [place] and [facility].

## 2019-04-07 ENCOUNTER — Encounter: Payer: PRIVATE HEALTH INSURANCE | Admitting: *Deleted

## 2019-04-07 DIAGNOSIS — Z9889 Other specified postprocedural states: Secondary | ICD-10-CM | POA: Diagnosis not present

## 2019-04-07 DIAGNOSIS — Z8679 Personal history of other diseases of the circulatory system: Secondary | ICD-10-CM

## 2019-04-07 NOTE — Progress Notes (Signed)
Daily Session Note  Patient Details  Name: Tyler Valencia MRN: [id number] Date of Birth: 08/25/1955 Referring Provider:     Cardiac Rehab from 03/28/2019 in [facility] Cardiac and Pulmonary Rehab  Referring Provider  Bartholome Bill MD      Encounter Date: 04/07/2019  Check In: Session Check In - 04/07/19 1511      Check-In   Staff Present  Renita Papa, RN BSN;Jeanna Durrell BS, Exercise Physiologist;Susanne Bice, RN, BSN, CCRP    Virtual Visit  No    Medication changes reported      No    Fall or balance concerns reported     No    Warm-up and Cool-down  Performed on first and last piece of equipment    Resistance Training Performed  Yes    VAD Patient?  No    PAD/SET Patient?  No      Pain Assessment   Currently in Pain?  No/denies          Social History   Tobacco Use  Smoking Status Current Every Day Smoker  . Packs/day: 0.50  Smokeless Tobacco Never Used    Goals Met:  Independence with exercise equipment Exercise tolerated well No report of cardiac concerns or symptoms  Goals Unmet:  Not Applicable  Comments: Pt able to follow exercise prescription today without complaint.  Will continue to monitor for progression.    Dr. Emily Filbert is Medical Director for [place] and [facility].

## 2019-04-11 ENCOUNTER — Other Ambulatory Visit: Payer: Self-pay

## 2019-04-11 ENCOUNTER — Encounter: Payer: PRIVATE HEALTH INSURANCE | Admitting: *Deleted

## 2019-04-11 DIAGNOSIS — Z8679 Personal history of other diseases of the circulatory system: Secondary | ICD-10-CM

## 2019-04-11 DIAGNOSIS — Z9889 Other specified postprocedural states: Secondary | ICD-10-CM | POA: Diagnosis not present

## 2019-04-11 NOTE — Progress Notes (Signed)
Daily Session Note  Patient Details  Name: Tyler Valencia MRN: [id number] Date of Birth: 1955-09-29 Referring Provider:     Cardiac Rehab from 03/28/2019 in [facility] Cardiac and Pulmonary Rehab  Referring Provider  Bartholome Bill MD      Encounter Date: 04/11/2019  Check In: Session Check In - 04/11/19 1519      Check-In   Supervising physician immediately available to respond to emergencies  See telemetry face sheet for immediately available ER MD    Location  [facility]-Cardiac & Pulmonary Rehab    Staff Present  Alberteen Sam, MA, RCEP, CCRP, CCET;Susanne Bice, RN, BSN, CCRP;Stellar Gensel Sherryll Burger, RN BSN;Jeanna Durrell BS, Exercise Physiologist    Virtual Visit  No    Medication changes reported      No    Fall or balance concerns reported     No    Warm-up and Cool-down  Performed on first and last piece of equipment    Resistance Training Performed  Yes    VAD Patient?  No    PAD/SET Patient?  No      Pain Assessment   Currently in Pain?  No/denies          Social History   Tobacco Use  Smoking Status Current Every Day Smoker  . Packs/day: 0.50  Smokeless Tobacco Never Used    Goals Met:  Exercise tolerated well No report of cardiac concerns or symptoms  Goals Unmet:  Not Applicable  Comments: Pt able to follow exercise prescription today without complaint.  Will continue to monitor for progression.    Dr. Emily Filbert is Medical Director for [place] and [facility].

## 2019-04-13 ENCOUNTER — Encounter: Payer: PRIVATE HEALTH INSURANCE | Admitting: *Deleted

## 2019-04-13 ENCOUNTER — Other Ambulatory Visit: Payer: Self-pay

## 2019-04-13 DIAGNOSIS — Z9889 Other specified postprocedural states: Secondary | ICD-10-CM | POA: Diagnosis not present

## 2019-04-13 DIAGNOSIS — Z8679 Personal history of other diseases of the circulatory system: Secondary | ICD-10-CM

## 2019-04-13 NOTE — Progress Notes (Signed)
Daily Session Note  Patient Details  Name: Kerry Chisolm MRN: [id number] Date of Birth: [birth year] Referring Provider:     Cardiac Rehab from 03/28/2019 in [facility] Cardiac and Pulmonary Rehab  Referring Provider  Bartholome Bill MD      Encounter Date: 04/13/2019  Check In: Session Check In - 04/13/19 1500      Check-In   Supervising physician immediately available to respond to emergencies  See telemetry face sheet for immediately available ER MD    Location  [facility]-Cardiac & Pulmonary Rehab    Staff Present  Renita Papa, RN Vickki Hearing, BA, ACSM CEP, Exercise Physiologist;Melissa Caiola RDN, LDN    Virtual Visit  No    Medication changes reported      No    Fall or balance concerns reported     No    Warm-up and Cool-down  Performed on first and last piece of equipment    Resistance Training Performed  Yes    VAD Patient?  No    PAD/SET Patient?  No      Pain Assessment   Currently in Pain?  No/denies          Social History   Tobacco Use  Smoking Status Current Every Day Smoker  . Packs/day: 0.50  Smokeless Tobacco Never Used    Goals Met:  Independence with exercise equipment Exercise tolerated well No report of cardiac concerns or symptoms Strength training completed today  Goals Unmet:  Not Applicable  Comments: Pt able to follow exercise prescription today without complaint.  Will continue to monitor for progression. Reviewed home exercise with pt today.  Pt plans to walk at home and attend [facility] for exercise.  Reviewed THR, pulse, RPE, sign and symptoms, NTG use, and when to call 911 or MD.  Also discussed weather considerations and indoor options.  Pt voiced understanding.    Dr. Emily Filbert is Medical Director for [place] and [facility].

## 2019-04-18 ENCOUNTER — Encounter: Payer: PRIVATE HEALTH INSURANCE | Admitting: *Deleted

## 2019-04-18 ENCOUNTER — Other Ambulatory Visit: Payer: Self-pay

## 2019-04-18 DIAGNOSIS — Z9889 Other specified postprocedural states: Secondary | ICD-10-CM | POA: Diagnosis not present

## 2019-04-18 DIAGNOSIS — Z8679 Personal history of other diseases of the circulatory system: Secondary | ICD-10-CM

## 2019-04-18 NOTE — Progress Notes (Signed)
Daily Session Note  Patient Details  Name: Tyler Valencia MRN: [id number] Date of Birth: [birth year] Referring Provider:     Cardiac Rehab from 03/28/2019 in [facility] Cardiac and Pulmonary Rehab  Referring Provider  Bartholome Bill MD      Encounter Date: 04/18/2019  Check In: Session Check In - 04/18/19 1515      Check-In   Supervising physician immediately available to respond to emergencies  See telemetry face sheet for immediately available ER MD    Location  [facility]-Cardiac & Pulmonary Rehab    Staff Present  Heath Lark, RN, BSN, CCRP;Adean Milosevic Sherryll Burger, RN Moises Blood, BS, ACSM CEP, Exercise Physiologist;Joseph Tessie Fass RCP,RRT,BSRT    Virtual Visit  No    Medication changes reported      No    Fall or balance concerns reported     No    Warm-up and Cool-down  Performed on first and last piece of equipment    Resistance Training Performed  Yes    VAD Patient?  No    PAD/SET Patient?  No      Pain Assessment   Currently in Pain?  No/denies          Social History   Tobacco Use  Smoking Status Current Every Day Smoker  . Packs/day: 0.50  Smokeless Tobacco Never Used    Goals Met:  Independence with exercise equipment Exercise tolerated well No report of cardiac concerns or symptoms  Goals Unmet:  Not Applicable  Comments: Pt able to follow exercise prescription today without complaint.  Will continue to monitor for progression.    Dr. Emily Filbert is Medical Director for [place] and [facility].

## 2019-04-20 ENCOUNTER — Encounter: Payer: PRIVATE HEALTH INSURANCE | Attending: Cardiology | Admitting: *Deleted

## 2019-04-20 ENCOUNTER — Other Ambulatory Visit: Payer: Self-pay

## 2019-04-20 ENCOUNTER — Encounter: Payer: Self-pay | Admitting: *Deleted

## 2019-04-20 DIAGNOSIS — Z79899 Other long term (current) drug therapy: Secondary | ICD-10-CM | POA: Diagnosis not present

## 2019-04-20 DIAGNOSIS — I712 Thoracic aortic aneurysm, without rupture: Secondary | ICD-10-CM | POA: Diagnosis not present

## 2019-04-20 DIAGNOSIS — Z9889 Other specified postprocedural states: Secondary | ICD-10-CM | POA: Insufficient documentation

## 2019-04-20 DIAGNOSIS — Z7982 Long term (current) use of aspirin: Secondary | ICD-10-CM | POA: Diagnosis not present

## 2019-04-20 DIAGNOSIS — Z8679 Personal history of other diseases of the circulatory system: Secondary | ICD-10-CM

## 2019-04-20 DIAGNOSIS — F17200 Nicotine dependence, unspecified, uncomplicated: Secondary | ICD-10-CM | POA: Diagnosis not present

## 2019-04-20 DIAGNOSIS — Z7901 Long term (current) use of anticoagulants: Secondary | ICD-10-CM | POA: Insufficient documentation

## 2019-04-20 DIAGNOSIS — I4891 Unspecified atrial fibrillation: Secondary | ICD-10-CM | POA: Insufficient documentation

## 2019-04-20 DIAGNOSIS — E7800 Pure hypercholesterolemia, unspecified: Secondary | ICD-10-CM | POA: Diagnosis not present

## 2019-04-20 NOTE — Progress Notes (Signed)
Daily Session Note  Patient Details  Name: Kenya Shiraishi MRN: [id number] Date of Birth: [birth year] Referring Provider:     Cardiac Rehab from 03/28/2019 in [facility] Cardiac and Pulmonary Rehab  Referring Provider  Bartholome Bill MD      Encounter Date: 04/20/2019  Check In: Session Check In - 04/20/19 1502      Check-In   Supervising physician immediately available to respond to emergencies  See telemetry face sheet for immediately available ER MD    Location  [facility]-Cardiac & Pulmonary Rehab    Staff Present  Renita Papa, RN Vickki Hearing, BA, ACSM CEP, Exercise Physiologist;Melissa Caiola RDN, LDN;Joseph Hood RCP,RRT,BSRT    Virtual Visit  No    Medication changes reported      No    Fall or balance concerns reported     No    Warm-up and Cool-down  Performed on first and last piece of equipment    Resistance Training Performed  Yes    VAD Patient?  No    PAD/SET Patient?  No      Pain Assessment   Currently in Pain?  No/denies          Social History   Tobacco Use  Smoking Status Current Every Day Smoker  . Packs/day: 0.50  Smokeless Tobacco Never Used    Goals Met:  Independence with exercise equipment Exercise tolerated well No report of cardiac concerns or symptoms Strength training completed today  Goals Unmet:  Not Applicable  Comments: Pt able to follow exercise prescription today without complaint.  Will continue to monitor for progression.    Dr. Emily Filbert is Medical Director for [place] and [facility].

## 2019-04-20 NOTE — Progress Notes (Signed)
Cardiac Individual Treatment Plan  Patient Details  Name: Tyler Valencia MRN: [id number] Date of Birth: 03/15/1956 Referring Provider:     Cardiac Rehab from 03/28/2019 in [facility] Cardiac and Pulmonary Rehab  Referring Provider  Bartholome Bill MD      Initial Encounter Date:    Cardiac Rehab from 03/28/2019 in [facility] Cardiac and Pulmonary Rehab  Date  03/28/19      Visit Diagnosis: Status post ascending aortic aneurysm repair  Patient's Home Medications on Admission:  Current Outpatient Medications:  .  amiodarone (PACERONE) 200 MG tablet, Take by mouth., Disp: , Rfl:  .  aspirin EC 81 MG tablet, Take 81 mg by mouth daily., Disp: , Rfl:  .  ezetimibe (ZETIA) 10 MG tablet, Take 10 mg by mouth daily., Disp: , Rfl:  .  metoprolol tartrate (LOPRESSOR) 25 MG tablet, Take 25 mg by mouth 2 (two) times daily., Disp: , Rfl:  .  oxybutynin (DITROPAN) 5 MG tablet, Take by mouth., Disp: , Rfl:  .  sertraline (ZOLOFT) 25 MG tablet, Take 25 mg by mouth daily., Disp: , Rfl:  .  traZODone (DESYREL) 50 MG tablet, Take 50 mg by mouth at bedtime., Disp: , Rfl:  .  warfarin (COUMADIN) 4 MG tablet, Take 4 mg by mouth daily. Take in addition to 7m for a total of 9375mdaily, Disp: , Rfl:  .  warfarin (COUMADIN) 5 MG tablet, Take 5 mg by mouth daily. Take in addition to 75m27mor a total of 9mg63mily, Disp: , Rfl:   Past Medical History: Past Medical History:  Diagnosis Date  . AAA (abdominal aortic aneurysm) (HCC)[place]. Atrial fibrillation (HCC)[place]. High cholesterol   . Status post mechanical aortic valve replacement     Tobacco Use: Social History   Tobacco Use  Smoking Status Current Every Day Smoker  . Packs/day: 0.50  Smokeless Tobacco Never Used    Labs: Recent Review Flowsheet Data    There is no flowsheet data to display.       Exercise Target Goals: Exercise Program Goal: Individual exercise prescription set using results from initial 6 min walk test and THRR while considering   patient's activity barriers and safety.   Exercise Prescription Goal: Initial exercise prescription builds to 30-45 minutes a day of aerobic activity, 2-3 days per week.  Home exercise guidelines will be given to patient during program as part of exercise prescription that the participant will acknowledge.  Activity Barriers & Risk Stratification: Activity Barriers & Cardiac Risk Stratification - 03/28/19 0911      Activity Barriers & Cardiac Risk Stratification   Activity Barriers  Deconditioning;Muscular Weakness;Balance Concerns    Cardiac Risk Stratification  High       6 Minute Walk: 6 Minute Walk    Row Name 03/28/19 0910         6 Minute Walk   Phase  Initial     Distance  1090 feet     Walk Time  6 minutes     # of Rest Breaks  0     MPH  3.23     METS  2.06     RPE  11     VO2 Peak  11.31     Symptoms  No     Resting HR  81 bpm     Resting BP  134/72     Resting Oxygen Saturation   97 %     Exercise Oxygen Saturation  during 6 min walk  98 %     Max Ex. HR  91 bpm     Max Ex. BP  146/74     2 Minute Post BP  104/66        Oxygen Initial Assessment:   Oxygen Re-Evaluation:   Oxygen Discharge (Final Oxygen Re-Evaluation):   Initial Exercise Prescription: Initial Exercise Prescription - 03/28/19 0900      Date of Initial Exercise RX and Referring Provider   Date  03/28/19    Referring Provider  Bartholome Bill MD      Treadmill   MPH  2    Grade  0.5    Minutes  15    METs  2.67      NuStep   Level  3    SPM  80    Minutes  15    METs  2      Arm Ergometer   Level  2    RPM  30    Minutes  15    METs  2      Recumbant Elliptical   Level  1    RPM  50    Minutes  15    METs  2      T5 Nustep   Level  3    SPM  80    Minutes  15    METs  2      Biostep-RELP   Level  3    SPM  50    Minutes  15    METs  2      Prescription Details   Frequency (times per week)  2    Duration  Progress to 30 minutes of continuous aerobic  without signs/symptoms of physical distress      Intensity   THRR 40-80% of Max Heartrate  112-143    Ratings of Perceived Exertion  11-13    Perceived Dyspnea  0-4      Progression   Progression  Continue to progress workloads to maintain intensity without signs/symptoms of physical distress.      Resistance Training   Training Prescription  Yes    Weight  3 lbs    Reps  10-15       Perform Capillary Blood Glucose checks as needed.  Exercise Prescription Changes: Exercise Prescription Changes    Row Name 03/28/19 0900 04/12/19 0800 04/13/19 1500         Response to Exercise   Blood Pressure (Admit)  134/72  100/58  -     Blood Pressure (Exercise)  146/74  134/66  -     Blood Pressure (Exit)  104/66  94/60  -     Heart Rate (Admit)  81 bpm  80 bpm  -     Heart Rate (Exercise)  91 bpm  102 bpm  -     Heart Rate (Exit)  89 bpm  81 bpm  -     Oxygen Saturation (Admit)  97 %  -  -     Oxygen Saturation (Exercise)  98 %  -  -     Rating of Perceived Exertion (Exercise)  11  13  -     Symptoms  none  none  -     Comments  walk test results  -  -     Duration  -  Continue with 30 min of aerobic exercise without signs/symptoms of physical distress.  -  Progression   Progression  -  Continue to progress workloads to maintain intensity without signs/symptoms of physical distress.  -     Average METs  -  2.6  -       Resistance Training   Training Prescription  -  Yes  -     Weight  -  3 lb  -     Reps  -  10-15  -       Interval Training   Interval Training  -  No  -       Treadmill   MPH  -  2  -     Grade  -  0.5  -     Minutes  -  15  -     METs  -  2.67  -       T5 Nustep   Level  -  3  -     SPM  -  80  -     Minutes  -  15  -     METs  -  2.5  -       Home Exercise Plan   Plans to continue exercise at  -  -  Home (comment) walk at home and attend [facility]      Frequency  -  -  Add 1 additional day to program exercise sessions.     Initial Home  Exercises Provided  -  -  04/13/19        Exercise Comments: Exercise Comments    Row Name 03/30/19 1514 04/13/19 1527         Exercise Comments  First full day of exercise!  Patient was oriented to gym and equipment including functions, settings, policies, and procedures.  Patient's individual exercise prescription and treatment plan were reviewed.  All starting workloads were established based on the results of the 6 minute walk test done at initial orientation visit.  The plan for exercise progression was also introduced and progression will be customized based on patient's performance and goals.  Reviewed home exercise with pt today.  Pt plans to walk at home and attend [facility] for exercise.  Reviewed THR, pulse, RPE, sign and symptoms, NTG use, and when to call 911 or MD.  Also discussed weather considerations and indoor options.  Pt voiced understanding.         Exercise Goals and Review: Exercise Goals    Row Name 03/28/19 0916             Exercise Goals   Increase Physical Activity  Yes       Intervention  Provide advice, education, support and counseling about physical activity/exercise needs.;Develop an individualized exercise prescription for aerobic and resistive training based on initial evaluation findings, risk stratification, comorbidities and participant's personal goals.       Expected Outcomes  Short Term: Attend rehab on a regular basis to increase amount of physical activity.;Long Term: Add in home exercise to make exercise part of routine and to increase amount of physical activity.;Long Term: Exercising regularly at least 3-5 days a week.       Increase Strength and Stamina  Yes       Intervention  Provide advice, education, support and counseling about physical activity/exercise needs.;Develop an individualized exercise prescription for aerobic and resistive training based on initial evaluation findings, risk stratification, comorbidities and participant's personal  goals.       Expected Outcomes  Short Term: Increase workloads from  initial exercise prescription for resistance, speed, and METs.;Short Term: Perform resistance training exercises routinely during rehab and add in resistance training at home;Long Term: Improve cardiorespiratory fitness, muscular endurance and strength as measured by increased METs and functional capacity (6MWT)       Able to understand and use rate of perceived exertion (RPE) scale  Yes       Intervention  Provide education and explanation on how to use RPE scale       Expected Outcomes  Short Term: Able to use RPE daily in rehab to express subjective intensity level;Long Term:  Able to use RPE to guide intensity level when exercising independently       Knowledge and understanding of Target Heart Rate Range (THRR)  Yes       Intervention  Provide education and explanation of THRR including how the numbers were predicted and where they are located for reference       Expected Outcomes  Short Term: Able to state/look up THRR;Short Term: Able to use daily as guideline for intensity in rehab;Long Term: Able to use THRR to govern intensity when exercising independently       Able to check pulse independently  Yes       Intervention  Review the importance of being able to check your own pulse for safety during independent exercise;Provide education and demonstration on how to check pulse in carotid and radial arteries.       Expected Outcomes  Short Term: Able to explain why pulse checking is important during independent exercise;Long Term: Able to check pulse independently and accurately       Understanding of Exercise Prescription  Yes       Intervention  Provide education, explanation, and written materials on patient's individual exercise prescription       Expected Outcomes  Short Term: Able to explain program exercise prescription;Long Term: Able to explain home exercise prescription to exercise independently          Exercise  Goals Re-Evaluation : Exercise Goals Re-Evaluation    Row Name 03/30/19 1514 04/12/19 0830 04/13/19 1528         Exercise Goal Re-Evaluation   Exercise Goals Review  Increase Physical Activity;Knowledge and understanding of Target Heart Rate Range (THRR);Able to understand and use rate of perceived exertion (RPE) scale;Understanding of Exercise Prescription  Increase Physical Activity;Increase Strength and Stamina;Able to understand and use rate of perceived exertion (RPE) scale;Able to understand and use Dyspnea scale;Knowledge and understanding of Target Heart Rate Range (THRR);Understanding of Exercise Prescription;Able to check pulse independently  Increase Physical Activity;Increase Strength and Stamina;Able to understand and use rate of perceived exertion (RPE) scale;Able to understand and use Dyspnea scale;Knowledge and understanding of Target Heart Rate Range (THRR);Understanding of Exercise Prescription;Able to check pulse independently     Comments  Reviewed RPE scale, THR and program prescription with pt today.  Pt voiced understanding and was given a copy of goals to take home.  Tallen has tolerated exercise well in his first weeks.  Staff will monitor progress.  Reviewed home exercise with pt today.  Pt plans to walk at home and attend [facility] for exercise.  Reviewed THR, pulse, RPE, sign and symptoms, NTG use, and when to call 911 or MD.  Also discussed weather considerations and indoor options.  Pt voiced understanding.     Expected Outcomes  Short: Use RPE daily to regulate intensity. Long: Follow program prescription in THR.  Short - attend HT consistently Long - increase  stamina overall  Short: add one day a week walking at home. Long: increased overall stamina        Discharge Exercise Prescription (Final Exercise Prescription Changes): Exercise Prescription Changes - 04/13/19 1500      Home Exercise Plan   Plans to continue exercise at  Home (comment)   walk at home and attend  [facility]    Frequency  Add 1 additional day to program exercise sessions.    Initial Home Exercises Provided  04/13/19       Nutrition:  Target Goals: Understanding of nutrition guidelines, daily intake of sodium <156m, cholesterol <2056m calories 30% from fat and 7% or less from saturated fats, daily to have 5 or more servings of fruits and vegetables.  Biometrics: Pre Biometrics - 03/28/19 0917      Pre Biometrics   Height  5' 11.9" (1.826 m)    Weight  189 lb 4.8 oz (85.9 kg)    BMI (Calculated)  25.75    Single Leg Stand  13.25 seconds        Nutrition Therapy Plan and Nutrition Goals: Nutrition Therapy & Goals - 04/04/19 1302      Nutrition Therapy   Diet  HH, low Na diet    Drug/Food Interactions  Coumadin/Vit K    Protein (specify units)  70g    Fiber  30 grams    Whole Grain Foods  3 servings    Saturated Fats  12 max. grams    Fruits and Vegetables  5 servings/day    Sodium  1.5 grams      Personal Nutrition Goals   Nutrition Goal  ST: add at least 1 fruit serving per day LT: regain balance, get back to pre-surgery condition, continue with no cigarettes - stopped mid October    Comments  Pt reports he was eating better when in the hospital. Now has yogurt (yoplait peach and strawberry) every morning with instant brown sugar maple;milk 2% and tangerine drink. Drinks 2 glasses (1-5 glasses) of milk per day. Pt reports thinking that he should drink more water (about 2 glasses currently). L: soup from [employer] or [employer] roast beef sandwich or burger king sandwich - no fries. D: last night: spaghetti (white) with meatballs and sauce. dinner varies. stoufers meatloaf and mashed potatoes and frozen vegetables. Beef is most prominant protein source, will have fresh fish or have chicken in frozen dinners. When cooks uses salt and vegetable oil w/ olive oil and butter. Frozen vegetables more so than fresh, will have about every other day (corn, brussell sprouts, etc. ). Pt  reports does not normally snack, but will sometimes have chocolate or ice cream. discussed Hh eating.      Intervention Plan   Intervention  Prescribe, educate and counsel regarding individualized specific dietary modifications aiming towards targeted core components such as weight, hypertension, lipid management, diabetes, heart failure and other comorbidities.;Nutrition handout(s) given to patient.    Expected Outcomes  Short Term Goal: Understand basic principles of dietary content, such as calories, fat, sodium, cholesterol and nutrients.;Short Term Goal: A plan has been developed with personal nutrition goals set during dietitian appointment.;Long Term Goal: Adherence to prescribed nutrition plan.       Nutrition Assessments: Nutrition Assessments - 03/28/19 0924      MEDFICTS Scores   Pre Score  103       Nutrition Goals Re-Evaluation:   Nutrition Goals Discharge (Final Nutrition Goals Re-Evaluation):   Psychosocial: Target Goals: Acknowledge presence or  absence of significant depression and/or stress, maximize coping skills, provide positive support system. Participant is able to verbalize types and ability to use techniques and skills needed for reducing stress and depression.   Initial Review & Psychosocial Screening: Initial Psych Review & Screening - 03/25/19 1407      Initial Review   Current issues with  None Identified      Family Dynamics   Good Support System?  Yes      Barriers   Psychosocial barriers to participate in program  There are no identifiable barriers or psychosocial needs.      Screening Interventions   Interventions  Encouraged to exercise;To provide support and resources with identified psychosocial needs    Expected Outcomes  Short Term goal: Utilizing psychosocial counselor, staff and physician to assist with identification of specific Stressors or current issues interfering with healing process. Setting desired goal for each stressor or current  issue identified.;Long Term Goal: Stressors or current issues are controlled or eliminated.;Short Term goal: Identification and review with participant of any Quality of Life or Depression concerns found by scoring the questionnaire.;Long Term goal: The participant improves quality of Life and PHQ9 Scores as seen by post scores and/or verbalization of changes       Quality of Life Scores:  Quality of Life - 03/28/19 0923      Quality of Life   Select  Quality of Life      Quality of Life Scores   Health/Function Pre  22.33 %    Socioeconomic Pre  20.86 %    Psych/Spiritual Pre  21.57 %    Family Pre  24.9 %    GLOBAL Pre  22.25 %      Scores of 19 and below usually indicate a poorer quality of life in these areas.  A difference of  2-3 points is a clinically meaningful difference.  A difference of 2-3 points in the total score of the Quality of Life Index has been associated with significant improvement in overall quality of life, self-image, physical symptoms, and general health in studies assessing change in quality of life.  PHQ-9: Recent Review Flowsheet Data    Depression screen [facility] 2/9 03/28/2019   Decreased Interest 1   Down, Depressed, Hopeless 0   PHQ - 2 Score 1   Altered sleeping 1   Tired, decreased energy 1   Change in appetite 0   Feeling bad or failure about yourself  1   Trouble concentrating 0   Moving slowly or fidgety/restless 1   Suicidal thoughts 0   PHQ-9 Score 5   Difficult doing work/chores Not difficult at all     Interpretation of Total Score  Total Score Depression Severity:  1-4 = Minimal depression, 5-9 = Mild depression, 10-14 = Moderate depression, 15-19 = Moderately severe depression, 20-27 = Severe depression   Psychosocial Evaluation and Intervention: Psychosocial Evaluation - 03/25/19 1409      Psychosocial Evaluation & Interventions   Comments  Arlie reports feeling pretty well after his surgery. His wife is supportive and he has been  staying with her so he feels well taken care of. He is looking forward to coming to cardiac rehab to gain independence in exercise and education about a heart healthy lifestyle    Expected Outcomes  Short: attend Cardiac Rehab. Long: independence with self care habits.       Psychosocial Re-Evaluation:   Psychosocial Discharge (Final Psychosocial Re-Evaluation):   Vocational Rehabilitation: Provide vocational rehab assistance  to qualifying candidates.   Vocational Rehab Evaluation & Intervention: Vocational Rehab - 03/25/19 1406      Initial Vocational Rehab Evaluation & Intervention   Assessment shows need for Vocational Rehabilitation  No       Education: Education Goals: Education classes will be provided on a variety of topics geared toward better understanding of heart health and risk factor modification. Participant will state understanding/return demonstration of topics presented as noted by education test scores.  Learning Barriers/Preferences: Learning Barriers/Preferences - 03/25/19 1406      Learning Barriers/Preferences   Learning Barriers  None    Learning Preferences  None       Education Topics:  AED/CPR: - Group verbal and written instruction with the use of models to demonstrate the basic use of the AED with the basic ABC's of resuscitation.   General Nutrition Guidelines/Fats and Fiber: -Group instruction provided by verbal, written material, models and posters to present the general guidelines for heart healthy nutrition. Gives an explanation and review of dietary fats and fiber.   Cardiac Rehab from 04/07/2019 in [facility] Cardiac and Pulmonary Rehab  Date  04/07/19  Educator  mc  Instruction Review Code  1- Verbalizes Understanding      Controlling Sodium/Reading Food Labels: -Group verbal and written material supporting the discussion of sodium use in heart healthy nutrition. Review and explanation with models, verbal and written materials for  utilization of the food label.   Exercise Physiology & General Exercise Guidelines: - Group verbal and written instruction with models to review the exercise physiology of the cardiovascular system and associated critical values. Provides general exercise guidelines with specific guidelines to those with heart or lung disease.    Aerobic Exercise & Resistance Training: - Gives group verbal and written instruction on the various components of exercise. Focuses on aerobic and resistive training programs and the benefits of this training and how to safely progress through these programs..   Flexibility, Balance, Mind/Body Relaxation: Provides group verbal/written instruction on the benefits of flexibility and balance training, including mind/body exercise modes such as yoga, pilates and tai chi.  Demonstration and skill practice provided.   Cardiac Rehab from 04/07/2019 in [facility] Cardiac and Pulmonary Rehab  Date  04/07/19  Educator  as  Instruction Review Code  1- Verbalizes Understanding      Stress and Anxiety: - Provides group verbal and written instruction about the health risks of elevated stress and causes of high stress.  Discuss the correlation between heart/lung disease and anxiety and treatment options. Review healthy ways to manage with stress and anxiety.   Depression: - Provides group verbal and written instruction on the correlation between heart/lung disease and depressed mood, treatment options, and the stigmas associated with seeking treatment.   Anatomy & Physiology of the Heart: - Group verbal and written instruction and models provide basic cardiac anatomy and physiology, with the coronary electrical and arterial systems. Review of Valvular disease and Heart Failure   Cardiac Procedures: - Group verbal and written instruction to review commonly prescribed medications for heart disease. Reviews the medication, class of the drug, and side effects. Includes the steps to  properly store meds and maintain the prescription regimen. (beta blockers and nitrates)   Cardiac Medications I: - Group verbal and written instruction to review commonly prescribed medications for heart disease. Reviews the medication, class of the drug, and side effects. Includes the steps to properly store meds and maintain the prescription regimen.   Cardiac Medications II: -Group verbal  and written instruction to review commonly prescribed medications for heart disease. Reviews the medication, class of the drug, and side effects. (all other drug classes)    Go Sex-Intimacy & Heart Disease, Get SMART - Goal Setting: - Group verbal and written instruction through game format to discuss heart disease and the return to sexual intimacy. Provides group verbal and written material to discuss and apply goal setting through the application of the S.M.A.R.T. Method.   Other Matters of the Heart: - Provides group verbal, written materials and models to describe Stable Angina and Peripheral Artery. Includes description of the disease process and treatment options available to the cardiac patient.   Exercise & Equipment Safety: - Individual verbal instruction and demonstration of equipment use and safety with use of the equipment.   Cardiac Rehab from 04/07/2019 in [facility] Cardiac and Pulmonary Rehab  Date  03/28/19  Educator  [facility]  Instruction Review Code  1- Verbalizes Understanding      Infection Prevention: - Provides verbal and written material to individual with discussion of infection control including proper hand washing and proper equipment cleaning during exercise session.   Cardiac Rehab from 04/07/2019 in [facility] Cardiac and Pulmonary Rehab  Date  03/28/19  Educator  [facility]  Instruction Review Code  1- Verbalizes Understanding      Falls Prevention: - Provides verbal and written material to individual with discussion of falls prevention and safety.   Cardiac Rehab from 04/07/2019 in  [facility] Cardiac and Pulmonary Rehab  Date  03/28/19  Educator  [facility]  Instruction Review Code  1- Verbalizes Understanding      Diabetes: - Individual verbal and written instruction to review signs/symptoms of diabetes, desired ranges of glucose level fasting, after meals and with exercise. Acknowledge that pre and post exercise glucose checks will be done for 3 sessions at entry of program.   Know Your Numbers and Risk Factors: -Group verbal and written instruction about important numbers in your health.  Discussion of what are risk factors and how they play a role in the disease process.  Review of Cholesterol, Blood Pressure, Diabetes, and BMI and the role they play in your overall health.   Sleep Hygiene: -Provides group verbal and written instruction about how sleep can affect your health.  Define sleep hygiene, discuss sleep cycles and impact of sleep habits. Review good sleep hygiene tips.    Other: -Provides group and verbal instruction on various topics (see comments)   Knowledge Questionnaire Score: Knowledge Questionnaire Score - 03/28/19 0924      Knowledge Questionnaire Score   Pre Score  24/26 Education: Nutrition and Exercise       Core Components/Risk Factors/Patient Goals at Admission: Personal Goals and Risk Factors at Admission - 03/28/19 0927      Core Components/Risk Factors/Patient Goals on Admission    Weight Management  Yes;Weight Loss    Intervention  Weight Management: Develop a combined nutrition and exercise program designed to reach desired caloric intake, while maintaining appropriate intake of nutrient and fiber, sodium and fats, and appropriate energy expenditure required for the weight goal.;Weight Management: Provide education and appropriate resources to help participant work on and attain dietary goals.    Admit Weight  189 lb 4.8 oz (85.9 kg)    Goal Weight: Short Term  184 lb (83.5 kg)    Goal Weight: Long Term  179 lb (81.2 kg)    Expected  Outcomes  Short Term: Continue to assess and modify interventions until short term weight  is achieved;Long Term: Adherence to nutrition and physical activity/exercise program aimed toward attainment of established weight goal;Weight Loss: Understanding of general recommendations for a balanced deficit meal plan, which promotes 1-2 lb weight loss per week and includes a negative energy balance of [id number] kcal/d;Understanding recommendations for meals to include 15-35% energy as protein, 25-35% energy from fat, 35-60% energy from carbohydrates, less than 240m of dietary cholesterol, 20-35 gm of total fiber daily;Understanding of distribution of calorie intake throughout the day with the consumption of 4-5 meals/snacks    Tobacco Cessation  Yes    Number of packs per day  0.5    Intervention  Assist the participant in steps to quit. Provide individualized education and counseling about committing to Tobacco Cessation, relapse prevention, and pharmacological support that can be provided by physician.;OAdvice worker assist with locating and accessing local/national Quit Smoking programs, and support quit date choice.    Expected Outcomes  Short Term: Will demonstrate readiness to quit, by selecting a quit date.;Short Term: Will quit all tobacco product use, adhering to prevention of relapse plan.;Long Term: Complete abstinence from all tobacco products for at least 12 months from quit date.    Lipids  Yes    Intervention  Provide education and support for participant on nutrition & aerobic/resistive exercise along with prescribed medications to achieve LDL <758m HDL >4046m   Expected Outcomes  Short Term: Participant states understanding of desired cholesterol values and is compliant with medications prescribed. Participant is following exercise prescription and nutrition guidelines.;Long Term: Cholesterol controlled with medications as prescribed, with individualized exercise RX and with  personalized nutrition plan. Value goals: LDL < 81m63mDL > 40 mg.       Core Components/Risk Factors/Patient Goals Review:    Core Components/Risk Factors/Patient Goals at Discharge (Final Review):    ITP Comments: ITP Comments    Row Name 03/25/19 1413 03/28/19 0909 03/30/19 1516 04/04/19 1332 04/04/19 1540   ITP Comments  Initial telephone orientation completed. Diagnosis can be found in CE 10/13. EP orientation scheduled for 11/9 at 8am  Completed 6MWT and gym orientation.  Initial ITP created and sent for review to Dr. MarkEmily Filbertdical Director.  First full day of exercise!  Patient was oriented to gym and equipment including functions, settings, policies, and procedures.  Patient's individual exercise prescription and treatment plan were reviewed.  All starting workloads were established based on the results of the 6 minute walk test done at initial orientation visit.  The plan for exercise progression was also introduced and progression will be customized based on patient's performance and goals.  completed initial RD eval  -   Row Name 04/20/19 0942           ITP Comments  30 day review competed . ITP sent to Dr MarkEmily Filbert review, changes as needed and ITP approval signature.          Comments:

## 2019-04-25 ENCOUNTER — Other Ambulatory Visit: Payer: Self-pay

## 2019-04-25 ENCOUNTER — Encounter: Payer: PRIVATE HEALTH INSURANCE | Admitting: *Deleted

## 2019-04-25 DIAGNOSIS — Z9889 Other specified postprocedural states: Secondary | ICD-10-CM | POA: Diagnosis not present

## 2019-04-25 NOTE — Progress Notes (Signed)
Daily Session Note  Patient Details  Name: Jesse Nosbisch MRN: [id number] Date of Birth: 1956-05-02 Referring Provider:     Cardiac Rehab from 03/28/2019 in [facility] Cardiac and Pul[facility] Rehab  Referring Provider  Bartholome Bill MD      Encounter Date: 04/25/2019  Check In: Session Check In - 04/25/19 1513      Check-In   Supervising physician immediately available to respond to emergencies  See telemetry face sheet for immediately available ER MD    Location  [facility]-Cardiac & Pulmonary Rehab    Staff Present  Heath Lark, RN, BSN, CCRP;Meredith Sherryll Burger, RN BSN;Joseph Tedd Sias, [place], ACSM CEP, Exercise Physiologist    Virtual Visit  No    Medication changes reported      No    Fall or balance concerns reported     No    Warm-up and Cool-down  Performed on first and last piece of equipment    Resistance Training Performed  Yes    VAD Patient?  No    PAD/SET Patient?  No      Pain Assessment   Currently in Pain?  No/denies          Social History   Tobacco Use  Smoking Status Current Every Day Smoker  . Packs/day: 0.50  Smokeless Tobacco Never Used    Goals Met:  Independence with exercise equipment Exercise tolerated well No report of cardiac concerns or symptoms  Goals Unmet:  Not Applicable  Comments: Pt able to follow exercise prescription today without complaint.  Will continue to monitor for progression.    Dr. Emily Filbert is Medical Director for [facility] and [facility].

## 2019-05-04 ENCOUNTER — Encounter: Payer: PRIVATE HEALTH INSURANCE | Admitting: *Deleted

## 2019-05-04 ENCOUNTER — Other Ambulatory Visit: Payer: Self-pay

## 2019-05-04 DIAGNOSIS — Z9889 Other specified postprocedural states: Secondary | ICD-10-CM | POA: Diagnosis not present

## 2019-05-04 DIAGNOSIS — Z8679 Personal history of other diseases of the circulatory system: Secondary | ICD-10-CM

## 2019-05-04 NOTE — Progress Notes (Signed)
Daily Session Note  Patient Details  Name: Tyler Valencia MRN: [id number] Date of Birth: [birth year] Referring Provider:     Cardiac Rehab from 03/28/2019 in [facility] Cardiac and Pulmonary Rehab  Referring Provider  Bartholome Bill MD      Encounter Date: 05/04/2019  Check In: Session Check In - 05/04/19 1505      Check-In   Supervising physician immediately available to respond to emergencies  See telemetry face sheet for immediately available ER MD    Location  [facility]-Cardiac & Pulmonary Rehab    Staff Present  Heath Lark, RN, BSN, CCRP;Raffael Bugarin Sherryll Burger, RN Vickki Hearing, BA, ACSM CEP, Exercise Physiologist;Melissa Caiola RDN, LDN    Virtual Visit  No    Medication changes reported      No    Fall or balance concerns reported     No    Warm-up and Cool-down  Performed on first and last piece of equipment    Resistance Training Performed  Yes    VAD Patient?  No    PAD/SET Patient?  No      Pain Assessment   Currently in Pain?  No/denies          Social History   Tobacco Use  Smoking Status Current Every Day Smoker  . Packs/day: 0.50  Smokeless Tobacco Never Used    Goals Met:  Independence with exercise equipment Exercise tolerated well No report of cardiac concerns or symptoms  Goals Unmet:  Not Applicable  Comments: Pt able to follow exercise prescription today without complaint.  Will continue to monitor for progression.    Dr. Emily Filbert is Medical Director for [place] [facility].

## 2019-05-09 ENCOUNTER — Other Ambulatory Visit: Payer: Self-pay

## 2019-05-09 ENCOUNTER — Encounter: Payer: PRIVATE HEALTH INSURANCE | Admitting: *Deleted

## 2019-05-09 DIAGNOSIS — Z8679 Personal history of other diseases of the circulatory system: Secondary | ICD-10-CM

## 2019-05-09 DIAGNOSIS — Z9889 Other specified postprocedural states: Secondary | ICD-10-CM | POA: Diagnosis not present

## 2019-05-09 NOTE — Progress Notes (Signed)
Daily Session Note  Patient Details  Name: Tyler Valencia MRN: [id number] Date of Birth: 08-16-1955 Referring Provider:     Cardiac Rehab from 03/28/2019 in [facility] Cardiac and Pulmonary Rehab  Referring Provider  Bartholome Bill MD      Encounter Date: 05/09/2019  Check In: Session Check In - 05/09/19 1502      Check-In   Supervising physician immediately available to respond to emergencies  See telemetry face sheet for immediately available ER MD    Location  [facility]-Cardiac & Pulmonary Rehab    Staff Present  Renita Papa, RN Moises Blood, BS, ACSM CEP, Exercise Physiologist;Joseph Tessie Fass RCP,RRT,BSRT    Virtual Visit  No    Medication changes reported      No    Fall or balance concerns reported     No    Warm-up and Cool-down  Performed on first and last piece of equipment    Resistance Training Performed  Yes    VAD Patient?  No    PAD/SET Patient?  No      Pain Assessment   Currently in Pain?  No/denies          Social History   Tobacco Use  Smoking Status Current Every Day Smoker  . Packs/day: 0.50  Smokeless Tobacco Never Used    Goals Met:  Independence with exercise equipment Exercise tolerated well No report of cardiac concerns or symptoms Strength training completed today  Goals Unmet:  Not Applicable  Comments: Pt able to follow exercise prescription today without complaint.  Will continue to monitor for progression.    Dr. Emily Filbert is Medical Director for [place] [facility].

## 2019-05-16 ENCOUNTER — Encounter: Payer: PRIVATE HEALTH INSURANCE | Admitting: *Deleted

## 2019-05-16 ENCOUNTER — Other Ambulatory Visit: Payer: Self-pay

## 2019-05-16 DIAGNOSIS — Z8679 Personal history of other diseases of the circulatory system: Secondary | ICD-10-CM

## 2019-05-16 DIAGNOSIS — Z9889 Other specified postprocedural states: Secondary | ICD-10-CM

## 2019-05-16 NOTE — Progress Notes (Signed)
Daily Session Note  Patient Details  Name: Tyler Valencia MRN: [id number] Date of Birth: March 22, 1956 Referring Provider:     Cardiac Rehab from 03/28/2019 in [facility] Cardiac and Pulmonary Rehab  Referring Provider  Bartholome Bill MD      Encounter Date: 05/16/2019  Check In: Session Check In - 05/16/19 1510      Check-In   Supervising physician immediately available to respond to emergencies  See telemetry face sheet for immediately available ER MD    Location  [facility]-Cardiac & Pulmonary Rehab    Staff Present  Renita Papa, RN BSN;Mary Kellie Shropshire, RN, BSN, MA;Joseph Hood RCP,RRT,BSRT;Melissa Caiola RDN, LDN    Virtual Visit  No    Medication changes reported      No    Fall or balance concerns reported     No    Warm-up and Cool-down  Performed on first and last piece of equipment    Resistance Training Performed  Yes    VAD Patient?  No    PAD/SET Patient?  No      Pain Assessment   Currently in Pain?  No/denies          Social History   Tobacco Use  Smoking Status Current Every Day Smoker  . Packs/day: 0.50  Smokeless Tobacco Never Used    Goals Met:  Independence with exercise equipment Exercise tolerated well No report of cardiac concerns or symptoms Strength training completed today  Goals Unmet:  Not Applicable  Comments: Pt able to follow exercise prescription today without complaint.  Will continue to monitor for progression.    Dr. Emily Filbert is Medical Director for [place] and [facility].

## 2019-05-18 ENCOUNTER — Encounter: Payer: Self-pay | Admitting: *Deleted

## 2019-05-18 DIAGNOSIS — Z8679 Personal history of other diseases of the circulatory system: Secondary | ICD-10-CM

## 2019-05-18 DIAGNOSIS — Z9889 Other specified postprocedural states: Secondary | ICD-10-CM

## 2019-05-18 NOTE — Progress Notes (Signed)
Cardiac Individual Treatment Plan  Patient Details  Name: Tyler Valencia MRN: [id number] Date of Birth: [birth year] Referring Provider:     Cardiac Rehab from 03/28/2019 in [facility] Cardiac and Pulmonary Rehab  Referring Provider  Bartholome Bill MD      Initial Encounter Date:    Cardiac Rehab from 03/28/2019 in [facility] Cardiac and Pulmonary Rehab  Date  03/28/19      Visit Diagnosis: Status post ascending aortic aneurysm repair  Patient's Home Medications on Admission:  Current Outpatient Medications:  .  amiodarone (PACERONE) 200 MG tablet, Take by mouth., Disp: , Rfl:  .  aspirin EC 81 MG tablet, Take 81 mg by mouth daily., Disp: , Rfl:  .  ezetimibe (ZETIA) 10 MG tablet, Take 10 mg by mouth daily., Disp: , Rfl:  .  metoprolol tartrate (LOPRESSOR) 25 MG tablet, Take 25 mg by mouth 2 (two) times daily., Disp: , Rfl:  .  oxybutynin (DITROPAN) 5 MG tablet, Take by mouth., Disp: , Rfl:  .  sertraline (ZOLOFT) 25 MG tablet, Take 25 mg by mouth daily., Disp: , Rfl:  .  traZODone (DESYREL) 50 MG tablet, Take 50 mg by mouth at bedtime., Disp: , Rfl:  .  warfarin (COUMADIN) 4 MG tablet, Take 4 mg by mouth daily. Take in addition to 30m for a total of 952mdaily, Disp: , Rfl:  .  warfarin (COUMADIN) 5 MG tablet, Take 5 mg by mouth daily. Take in addition to 30m28mor a total of 9mg47mily, Disp: , Rfl:   Past Medical History: Past Medical History:  Diagnosis Date  . AAA (abdominal aortic aneurysm) (HCC)[place]. Atrial fibrillation (HCC)[place]. High cholesterol   . Status post mechanical aortic valve replacement     Tobacco Use: Social History   Tobacco Use  Smoking Status Current Every Day Smoker  . Packs/day: 0.50  Smokeless Tobacco Never Used    Labs: Recent Review Flowsheet Data    There is no flowsheet data to display.       Exercise Target Goals: Exercise Program Goal: Individual exercise prescription set using results from initial 6 min walk test and THRR while considering   patient's activity barriers and safety.   Exercise Prescription Goal: Initial exercise prescription builds to 30-45 minutes a day of aerobic activity, 2-3 days per week.  Home exercise guidelines will be given to patient during program as part of exercise prescription that the participant will acknowledge.  Activity Barriers & Risk Stratification: Activity Barriers & Cardiac Risk Stratification - 03/28/19 0911      Activity Barriers & Cardiac Risk Stratification   Activity Barriers  Deconditioning;Muscular Weakness;Balance Concerns    Cardiac Risk Stratification  High       6 Minute Walk: 6 Minute Walk    Row Name 03/28/19 0910         6 Minute Walk   Phase  Initial     Distance  1090 feet     Walk Time  6 minutes     # of Rest Breaks  0     MPH  3.23     METS  2.06     RPE  11     VO2 Peak  11.31     Symptoms  No     Resting HR  81 bpm     Resting BP  134/72     Resting Oxygen Saturation   97 %     Exercise Oxygen Saturation  during 6 min walk  98 %     Max Ex. HR  91 bpm     Max Ex. BP  146/74     2 Minute Post BP  104/66        Oxygen Initial Assessment:   Oxygen Re-Evaluation:   Oxygen Discharge (Final Oxygen Re-Evaluation):   Initial Exercise Prescription: Initial Exercise Prescription - 03/28/19 0900      Date of Initial Exercise RX and Referring Provider   Date  03/28/19    Referring Provider  Bartholome Bill MD      Treadmill   MPH  2    Grade  0.5    Minutes  15    METs  2.67      NuStep   Level  3    SPM  80    Minutes  15    METs  2      Arm Ergometer   Level  2    RPM  30    Minutes  15    METs  2      Recumbant Elliptical   Level  1    RPM  50    Minutes  15    METs  2      T5 Nustep   Level  3    SPM  80    Minutes  15    METs  2      Biostep-RELP   Level  3    SPM  50    Minutes  15    METs  2      Prescription Details   Frequency (times per week)  2    Duration  Progress to 30 minutes of continuous aerobic  without signs/symptoms of physical distress      Intensity   THRR 40-80% of Max Heartrate  112-143    Ratings of Perceived Exertion  11-13    Perceived Dyspnea  0-4      Progression   Progression  Continue to progress workloads to maintain intensity without signs/symptoms of physical distress.      Resistance Training   Training Prescription  Yes    Weight  3 lbs    Reps  10-15       Perform Capillary Blood Glucose checks as needed.  Exercise Prescription Changes: Exercise Prescription Changes    Row Name 03/28/19 0900 04/12/19 0800 04/13/19 1500 04/27/19 1000 05/10/19 1200     Response to Exercise   Blood Pressure (Admit)  134/72  100/58  --  102/62  102/62   Blood Pressure (Exercise)  146/74  134/66  --  128/64  128/62   Blood Pressure (Exit)  104/66  94/60  --  102/70  112/62   Heart Rate (Admit)  81 bpm  80 bpm  --  81 bpm  83 bpm   Heart Rate (Exercise)  91 bpm  102 bpm  --  100 bpm  101 bpm   Heart Rate (Exit)  89 bpm  81 bpm  --  77 bpm  85 bpm   Oxygen Saturation (Admit)  97 %  --  --  --  --   Oxygen Saturation (Exercise)  98 %  --  --  --  --   Rating of Perceived Exertion (Exercise)  11  13  --  13  13   Symptoms  none  none  --  none  none   Comments  walk test results  --  --  --  --  Duration  --  Continue with 30 min of aerobic exercise without signs/symptoms of physical distress.  --  Continue with 30 min of aerobic exercise without signs/symptoms of physical distress.  Continue with 30 min of aerobic exercise without signs/symptoms of physical distress.   Intensity  --  --  --  THRR unchanged  THRR unchanged     Progression   Progression  --  Continue to progress workloads to maintain intensity without signs/symptoms of physical distress.  --  Continue to progress workloads to maintain intensity without signs/symptoms of physical distress.  Continue to progress workloads to maintain intensity without signs/symptoms of physical distress.   Average METs  --   2.6  --  2.67  3.15     Resistance Training   Training Prescription  --  Yes  --  Yes  Yes   Weight  --  3 lb  --  3 lb  3 lb   Reps  --  10-15  --  10-15  10-15     Interval Training   Interval Training  --  No  --  No  No     Treadmill   MPH  --  2  --  2  --   Grade  --  0.5  --  0.5  --   Minutes  --  15  --  15  --   METs  --  2.67  --  2.67  --     NuStep   Level  --  --  --  4  4   SPM  --  --  --  --  80   Minutes  --  --  --  15  15   METs  --  --  --  3.5  3.2     Arm Ergometer   Level  --  --  --  2  --   Minutes  --  --  --  15  --   METs  --  --  --  3  --     Recumbant Elliptical   Level  --  --  --  3  4   Minutes  --  --  --  15  15   METs  --  --  --  2.2  3.2     T5 Nustep   Level  --  3  --  --  --   SPM  --  80  --  --  --   Minutes  --  15  --  --  --   METs  --  2.5  --  --  --     Biostep-RELP   Level  --  --  --  3  --   Minutes  --  --  --  15  --   METs  --  --  --  3  --     Home Exercise Plan   Plans to continue exercise at  --  --  Home (comment) walk at home and attend [facility] (comment) walk at home and attend [facility] (comment) walk at home and attend [facility]    Frequency  --  --  Add 1 additional day to program exercise sessions.  Add 1 additional day to program exercise sessions.  Add 1 additional day to program exercise sessions.   Initial Home Exercises Provided  --  --  04/13/19  04/13/19  04/13/19  Exercise Comments: Exercise Comments    Row Name 03/30/19 1514 04/13/19 1527         Exercise Comments  First full day of exercise!  Patient was oriented to gym and equipment including functions, settings, policies, and procedures.  Patient's individual exercise prescription and treatment plan were reviewed.  All starting workloads were established based on the results of the 6 minute walk test done at initial orientation visit.  The plan for exercise progression was also introduced and progression will be  customized based on patient's performance and goals.  Reviewed home exercise with pt today.  Pt plans to walk at home and attend [facility] for exercise.  Reviewed THR, pulse, RPE, sign and symptoms, NTG use, and when to call 911 or MD.  Also discussed weather considerations and indoor options.  Pt voiced understanding.         Exercise Goals and Review: Exercise Goals    Row Name 03/28/19 0916             Exercise Goals   Increase Physical Activity  Yes       Intervention  Provide advice, education, support and counseling about physical activity/exercise needs.;Develop an individualized exercise prescription for aerobic and resistive training based on initial evaluation findings, risk stratification, comorbidities and participant's personal goals.       Expected Outcomes  Short Term: Attend rehab on a regular basis to increase amount of physical activity.;Long Term: Add in home exercise to make exercise part of routine and to increase amount of physical activity.;Long Term: Exercising regularly at least 3-5 days a week.       Increase Strength and Stamina  Yes       Intervention  Provide advice, education, support and counseling about physical activity/exercise needs.;Develop an individualized exercise prescription for aerobic and resistive training based on initial evaluation findings, risk stratification, comorbidities and participant's personal goals.       Expected Outcomes  Short Term: Increase workloads from initial exercise prescription for resistance, speed, and METs.;Short Term: Perform resistance training exercises routinely during rehab and add in resistance training at home;Long Term: Improve cardiorespiratory fitness, muscular endurance and strength as measured by increased METs and functional capacity (6MWT)       Able to understand and use rate of perceived exertion (RPE) scale  Yes       Intervention  Provide education and explanation on how to use RPE scale       Expected  Outcomes  Short Term: Able to use RPE daily in rehab to express subjective intensity level;Long Term:  Able to use RPE to guide intensity level when exercising independently       Knowledge and understanding of Target Heart Rate Range (THRR)  Yes       Intervention  Provide education and explanation of THRR including how the numbers were predicted and where they are located for reference       Expected Outcomes  Short Term: Able to state/look up THRR;Short Term: Able to use daily as guideline for intensity in rehab;Long Term: Able to use THRR to govern intensity when exercising independently       Able to check pulse independently  Yes       Intervention  Review the importance of being able to check your own pulse for safety during independent exercise;Provide education and demonstration on how to check pulse in carotid and radial arteries.       Expected Outcomes  Short Term: Able to explain why  pulse checking is important during independent exercise;Long Term: Able to check pulse independently and accurately       Understanding of Exercise Prescription  Yes       Intervention  Provide education, explanation, and written materials on patient's individual exercise prescription       Expected Outcomes  Short Term: Able to explain program exercise prescription;Long Term: Able to explain home exercise prescription to exercise independently          Exercise Goals Re-Evaluation : Exercise Goals Re-Evaluation    Row Name 03/30/19 1514 04/12/19 0830 04/13/19 1528 04/27/19 1052 05/09/19 1514     Exercise Goal Re-Evaluation   Exercise Goals Review  Increase Physical Activity;Knowledge and understanding of Target Heart Rate Range (THRR);Able to understand and use rate of perceived exertion (RPE) scale;Understanding of Exercise Prescription  Increase Physical Activity;Increase Strength and Stamina;Able to understand and use rate of perceived exertion (RPE) scale;Able to understand and use Dyspnea  scale;Knowledge and understanding of Target Heart Rate Range (THRR);Understanding of Exercise Prescription;Able to check pulse independently  Increase Physical Activity;Increase Strength and Stamina;Able to understand and use rate of perceived exertion (RPE) scale;Able to understand and use Dyspnea scale;Knowledge and understanding of Target Heart Rate Range (THRR);Understanding of Exercise Prescription;Able to check pulse independently  Increase Physical Activity;Increase Strength and Stamina;Understanding of Exercise Prescription  Increase Strength and Stamina;Increase Physical Activity   Comments  Reviewed RPE scale, THR and program prescription with pt today.  Pt voiced understanding and was given a copy of goals to take home.  Tyler Valencia has tolerated exercise well in his first weeks.  Staff will monitor progress.  Reviewed home exercise with pt today.  Pt plans to walk at home and attend [facility] for exercise.  Reviewed THR, pulse, RPE, sign and symptoms, NTG use, and when to call 911 or MD.  Also discussed weather considerations and indoor options.  Pt voiced understanding.  Tyler Valencia has been doing well in rehab.  He is now adding in the treadmill to his exercise routine. He is also on level 3 for the recumbent elliptical.  We will continue to monitor his progress.  Patient is not doing any exercise at home at the moment. He is going to walk his dog at home and possibly join the [facility]. Gave patient information about the [facility] and how to join.   Expected Outcomes  Short: Use RPE daily to regulate intensity. Long: Follow program prescription in THR.  Short - attend HT consistently Long - increase stamina overall  Short: add one day a week walking at home. Long: increased overall stamina  Short: Increase workload on NuStep.  Long: Continue to improve stamina.  Short: add a day of walking at home. Long: Join the [facility] and maintain an exercise routine independently.   [place] Name 05/10/19 1256              Exercise Goal Re-Evaluation   Exercise Goals Review  Increase Physical Activity;Increase Strength and Stamina;Able to understand and use rate of perceived exertion (RPE) scale;Knowledge and understanding of Target Heart Rate Range (THRR);Able to check pulse independently;Understanding of Exercise Prescription          Discharge Exercise Prescription (Final Exercise Prescription Changes): Exercise Prescription Changes - 05/10/19 1200      Response to Exercise   Blood Pressure (Admit)  102/62    Blood Pressure (Exercise)  128/62    Blood Pressure (Exit)  112/62    Heart Rate (Admit)  83 bpm    Heart Rate (  Exercise)  101 bpm    Heart Rate (Exit)  85 bpm    Rating of Perceived Exertion (Exercise)  13    Symptoms  none    Duration  Continue with 30 min of aerobic exercise without signs/symptoms of physical distress.    Intensity  THRR unchanged      Progression   Progression  Continue to progress workloads to maintain intensity without signs/symptoms of physical distress.    Average METs  3.15      Resistance Training   Training Prescription  Yes    Weight  3 lb    Reps  10-15      Interval Training   Interval Training  No      NuStep   Level  4    SPM  80    Minutes  15    METs  3.2      Recumbant Elliptical   Level  4    Minutes  15    METs  3.2      Home Exercise Plan   Plans to continue exercise at  Home (comment)   walk at home and attend [facility]    Frequency  Add 1 additional day to program exercise sessions.    Initial Home Exercises Provided  04/13/19       Nutrition:  Target Goals: Understanding of nutrition guidelines, daily intake of sodium '1500mg'$ , cholesterol '200mg'$ , calories 30% from fat and 7% or less from saturated fats, daily to have 5 or more servings of fruits and vegetables.  Biometrics: Pre Biometrics - 03/28/19 0917      Pre Biometrics   Height  5' 11.9" (1.826 m)    Weight  189 lb 4.8 oz (85.9 kg)    BMI (Calculated)  25.75    Single Leg  Stand  13.25 seconds        Nutrition Therapy Plan and Nutrition Goals: Nutrition Therapy & Goals - 04/04/19 1302      Nutrition Therapy   Diet  HH, low Na diet    Drug/Food Interactions  Coumadin/Vit K    Protein (specify units)  70g    Fiber  30 grams    Whole Grain Foods  3 servings    Saturated Fats  12 max. grams    Fruits and Vegetables  5 servings/day    Sodium  1.5 grams      Personal Nutrition Goals   Nutrition Goal  ST: add at least 1 fruit serving per day LT: regain balance, get back to pre-surgery condition, continue with no cigarettes - stopped mid October    Comments  Pt reports he was eating better when in the hospital. Now has yogurt (yoplait peach and strawberry) every morning with instant brown sugar maple;milk 2% and tangerine drink. Drinks 2 glasses (1-5 glasses) of milk per day. Pt reports thinking that he should drink more water (about 2 glasses currently). L: soup from [employer] or [employer] roast beef sandwich or burger king sandwich - no fries. D: last night: spaghetti (white) with meatballs and sauce. dinner varies. stoufers meatloaf and mashed potatoes and frozen vegetables. Beef is most prominant protein source, will have fresh fish or have chicken in frozen dinners. When cooks uses salt and vegetable oil w/ olive oil and butter. Frozen vegetables more so than fresh, will have about every other day (corn, brussell sprouts, etc. ). Pt reports does not normally snack, but will sometimes have chocolate or ice cream. discussed Hh eating.  Intervention Plan   Intervention  Prescribe, educate and counsel regarding individualized specific dietary modifications aiming towards targeted core components such as weight, hypertension, lipid management, diabetes, heart failure and other comorbidities.;Nutrition handout(s) given to patient.    Expected Outcomes  Short Term Goal: Understand basic principles of dietary content, such as calories, fat, sodium, cholesterol and  nutrients.;Short Term Goal: A plan has been developed with personal nutrition goals set during dietitian appointment.;Long Term Goal: Adherence to prescribed nutrition plan.       Nutrition Assessments: Nutrition Assessments - 03/28/19 0924      MEDFICTS Scores   Pre Score  103       Nutrition Goals Re-Evaluation: Nutrition Goals Re-Evaluation    Row Name 04/25/19 1551             Goals   Nutrition Goal  ST: going to the grocery store LT: regain balance, get back to pre-surgery condition, continue with no cigarettes - stopped mid October       Comment  Pt reports not being so regimented, now missing meals - missed B and L today. Pt reports its getting to the grocery store and son is home right now so it is hard to maintain. talked about some quick meals he could have on hand.       Expected Outcome  ST: going to the grocery store LT: regain balance, get back to pre-surgery condition, continue with no cigarettes - stopped mid October          Nutrition Goals Discharge (Final Nutrition Goals Re-Evaluation): Nutrition Goals Re-Evaluation - 04/25/19 1551      Goals   Nutrition Goal  ST: going to the grocery store LT: regain balance, get back to pre-surgery condition, continue with no cigarettes - stopped mid October    Comment  Pt reports not being so regimented, now missing meals - missed B and L today. Pt reports its getting to the grocery store and son is home right now so it is hard to maintain. talked about some quick meals he could have on hand.    Expected Outcome  ST: going to the grocery store LT: regain balance, get back to pre-surgery condition, continue with no cigarettes - stopped mid October       Psychosocial: Target Goals: Acknowledge presence or absence of significant depression and/or stress, maximize coping skills, provide positive support system. Participant is able to verbalize types and ability to use techniques and skills needed for reducing stress and  depression.   Initial Review & Psychosocial Screening: Initial Psych Review & Screening - 03/25/19 1407      Initial Review   Current issues with  None Identified      Family Dynamics   Good Support System?  Yes      Barriers   Psychosocial barriers to participate in program  There are no identifiable barriers or psychosocial needs.      Screening Interventions   Interventions  Encouraged to exercise;To provide support and resources with identified psychosocial needs    Expected Outcomes  Short Term goal: Utilizing psychosocial counselor, staff and physician to assist with identification of specific Stressors or current issues interfering with healing process. Setting desired goal for each stressor or current issue identified.;Long Term Goal: Stressors or current issues are controlled or eliminated.;Short Term goal: Identification and review with participant of any Quality of Life or Depression concerns found by scoring the questionnaire.;Long Term goal: The participant improves quality of Life and PHQ9 Scores as seen  by post scores and/or verbalization of changes       Quality of Life Scores:  Quality of Life - 03/28/19 0923      Quality of Life   Select  Quality of Life      Quality of Life Scores   Health/Function Pre  22.33 %    Socioeconomic Pre  20.86 %    Psych/Spiritual Pre  21.57 %    Family Pre  24.9 %    GLOBAL Pre  22.25 %      Scores of 19 and below usually indicate a poorer quality of life in these areas.  A difference of  2-3 points is a clinically meaningful difference.  A difference of 2-3 points in the total score of the Quality of Life Index has been associated with significant improvement in overall quality of life, self-image, physical symptoms, and general health in studies assessing change in quality of life.  PHQ-9: Recent Review Flowsheet Data    Depression screen [facility] 2/9 05/04/2019 03/28/2019   Decreased Interest 1 1   Down, Depressed, Hopeless 1 0    PHQ - 2 Score 2 1   Altered sleeping 3 1   Tired, decreased energy 1 1   Change in appetite 1 0   Feeling bad or failure about yourself  1 1   Trouble concentrating 0 0   Moving slowly or fidgety/restless 0 1   Suicidal thoughts 0 0   PHQ-9 Score 8 5   Difficult doing work/chores Somewhat difficult Not difficult at all     Interpretation of Total Score  Total Score Depression Severity:  1-4 = Minimal depression, 5-9 = Mild depression, 10-14 = Moderate depression, 15-19 = Moderately severe depression, 20-27 = Severe depression   Psychosocial Evaluation and Intervention: Psychosocial Evaluation - 03/25/19 1409      Psychosocial Evaluation & Interventions   Comments  Tyler Valencia reports feeling pretty well after his surgery. His wife is supportive and he has been staying with her so he feels well taken care of. He is looking forward to coming to cardiac rehab to gain independence in exercise and education about a heart healthy lifestyle    Expected Outcomes  Short: attend Cardiac Rehab. Long: independence with self care habits.       Psychosocial Re-Evaluation: Psychosocial Re-Evaluation    [place] Name 05/04/19 1525             Psychosocial Re-Evaluation   Current issues with  Current Stress Concerns;Current Sleep Concerns       Comments  Tyler Valencia states his stress is with his son who is 52 but it is manageable.  He is having some issues sleeping.  He feels he needs to get his CPAP hooked back up.  He has been sleeping on the couch beacuse he can elevate his head easier.       Expected Outcomes  Short - get CPAP hooked back up and sleep in the bed Long - develop better sleep patterns          Psychosocial Discharge (Final Psychosocial Re-Evaluation): Psychosocial Re-Evaluation - 05/04/19 1525      Psychosocial Re-Evaluation   Current issues with  Current Stress Concerns;Current Sleep Concerns    Comments  Tyler Valencia states his stress is with his son who is 40 but it is manageable.  He is having  some issues sleeping.  He feels he needs to get his CPAP hooked back up.  He has been sleeping on the couch beacuse he  can elevate his head easier.    Expected Outcomes  Short - get CPAP hooked back up and sleep in the bed Long - develop better sleep patterns       Vocational Rehabilitation: Provide vocational rehab assistance to qualifying candidates.   Vocational Rehab Evaluation & Intervention: Vocational Rehab - 03/25/19 1406      Initial Vocational Rehab Evaluation & Intervention   Assessment shows need for Vocational Rehabilitation  No       Education: Education Goals: Education classes will be provided on a variety of topics geared toward better understanding of heart health and risk factor modification. Participant will state understanding/return demonstration of topics presented as noted by education test scores.  Learning Barriers/Preferences: Learning Barriers/Preferences - 03/25/19 1406      Learning Barriers/Preferences   Learning Barriers  None    Learning Preferences  None       Education Topics:  AED/CPR: - Group verbal and written instruction with the use of models to demonstrate the basic use of the AED with the basic ABC's of resuscitation.   General Nutrition Guidelines/Fats and Fiber: -Group instruction provided by verbal, written material, models and posters to present the general guidelines for heart healthy nutrition. Gives an explanation and review of dietary fats and fiber.   Cardiac Rehab from 04/07/2019 in [facility] Cardiac and Pulmonary Rehab  Date  04/07/19  Educator  [facility]  Instruction Review Code  1- Verbalizes Understanding      Controlling Sodium/Reading Food Labels: -Group verbal and written material supporting the discussion of sodium use in heart healthy nutrition. Review and explanation with models, verbal and written materials for utilization of the food label.   Exercise Physiology & General Exercise Guidelines: - Group verbal and  written instruction with models to review the exercise physiology of the cardiovascular system and associated critical values. Provides general exercise guidelines with specific guidelines to those with heart or lung disease.    Aerobic Exercise & Resistance Training: - Gives group verbal and written instruction on the various components of exercise. Focuses on aerobic and resistive training programs and the benefits of this training and how to safely progress through these programs..   Flexibility, Balance, Mind/Body Relaxation: Provides group verbal/written instruction on the benefits of flexibility and balance training, including mind/body exercise modes such as yoga, pilates and tai chi.  Demonstration and skill practice provided.   Cardiac Rehab from 04/07/2019 in [facility] Cardiac and Pulmonary Rehab  Date  04/07/19  Educator  as  Instruction Review Code  1- Verbalizes Understanding      Stress and Anxiety: - Provides group verbal and written instruction about the health risks of elevated stress and causes of high stress.  Discuss the correlation between heart/lung disease and anxiety and treatment options. Review healthy ways to manage with stress and anxiety.   Depression: - Provides group verbal and written instruction on the correlation between heart/lung disease and depressed mood, treatment options, and the stigmas associated with seeking treatment.   Anatomy & Physiology of the Heart: - Group verbal and written instruction and models provide basic cardiac anatomy and physiology, with the coronary electrical and arterial systems. Review of Valvular disease and Heart Failure   Cardiac Procedures: - Group verbal and written instruction to review commonly prescribed medications for heart disease. Reviews the medication, class of the drug, and side effects. Includes the steps to properly store meds and maintain the prescription regimen. (beta blockers and nitrates)   Cardiac  Medications I: - Group  verbal and written instruction to review commonly prescribed medications for heart disease. Reviews the medication, class of the drug, and side effects. Includes the steps to properly store meds and maintain the prescription regimen.   Cardiac Medications II: -Group verbal and written instruction to review commonly prescribed medications for heart disease. Reviews the medication, class of the drug, and side effects. (all other drug classes)    Go Sex-Intimacy & Heart Disease, Get SMART - Goal Setting: - Group verbal and written instruction through game format to discuss heart disease and the return to sexual intimacy. Provides group verbal and written material to discuss and apply goal setting through the application of the S.M.A.R.T. Method.   Other Matters of the Heart: - Provides group verbal, written materials and models to describe Stable Angina and Peripheral Artery. Includes description of the disease process and treatment options available to the cardiac patient.   Exercise & Equipment Safety: - Individual verbal instruction and demonstration of equipment use and safety with use of the equipment.   Cardiac Rehab from 04/07/2019 in [facility] Cardiac and Pulmonary Rehab  Date  03/28/19  Educator  [facility]  Instruction Review Code  1- Verbalizes Understanding      Infection Prevention: - Provides verbal and written material to individual with discussion of infection control including proper hand washing and proper equipment cleaning during exercise session.   Cardiac Rehab from 04/07/2019 in [facility] Cardiac and Pulmonary Rehab  Date  03/28/19  Educator  [facility]  Instruction Review Code  1- Verbalizes Understanding      Falls Prevention: - Provides verbal and written material to individual with discussion of falls prevention and safety.   Cardiac Rehab from 04/07/2019 in [facility] Cardiac and Pulmonary Rehab  Date  03/28/19  Educator  [facility]  Instruction Review Code  1-  Verbalizes Understanding      Diabetes: - Individual verbal and written instruction to review signs/symptoms of diabetes, desired ranges of glucose level fasting, after meals and with exercise. Acknowledge that pre and post exercise glucose checks will be done for 3 sessions at entry of program.   Know Your Numbers and Risk Factors: -Group verbal and written instruction about important numbers in your health.  Discussion of what are risk factors and how they play a role in the disease process.  Review of Cholesterol, Blood Pressure, Diabetes, and BMI and the role they play in your overall health.   Sleep Hygiene: -Provides group verbal and written instruction about how sleep can affect your health.  Define sleep hygiene, discuss sleep cycles and impact of sleep habits. Review good sleep hygiene tips.    Other: -Provides group and verbal instruction on various topics (see comments)   Knowledge Questionnaire Score: Knowledge Questionnaire Score - 03/28/19 0924      Knowledge Questionnaire Score   Pre Score  24/26 Education: Nutrition and Exercise       Core Components/Risk Factors/Patient Goals at Admission: Personal Goals and Risk Factors at Admission - 03/28/19 0927      Core Components/Risk Factors/Patient Goals on Admission    Weight Management  Yes;Weight Loss    Intervention  Weight Management: Develop a combined nutrition and exercise program designed to reach desired caloric intake, while maintaining appropriate intake of nutrient and fiber, sodium and fats, and appropriate energy expenditure required for the weight goal.;Weight Management: Provide education and appropriate resources to help participant work on and attain dietary goals.    Admit Weight  189 lb 4.8 oz (85.9 kg)  Goal Weight: Short Term  184 lb (83.5 kg)    Goal Weight: Long Term  179 lb (81.2 kg)    Expected Outcomes  Short Term: Continue to assess and modify interventions until short term weight is  achieved;Long Term: Adherence to nutrition and physical activity/exercise program aimed toward attainment of established weight goal;Weight Loss: Understanding of general recommendations for a balanced deficit meal plan, which promotes 1-2 lb weight loss per week and includes a negative energy balance of [phone] kcal/d;Understanding recommendations for meals to include 15-35% energy as protein, 25-35% energy from fat, 35-60% energy from carbohydrates, less than '200mg'$  of dietary cholesterol, 20-35 gm of total fiber daily;Understanding of distribution of calorie intake throughout the day with the consumption of 4-5 meals/snacks    Tobacco Cessation  Yes    Number of packs per day  0.5    Intervention  Assist the participant in steps to quit. Provide individualized education and counseling about committing to Tobacco Cessation, relapse prevention, and pharmacological support that can be provided by physician.;Advice worker, assist with locating and accessing local/national Quit Smoking programs, and support quit date choice.    Expected Outcomes  Short Term: Will demonstrate readiness to quit, by selecting a quit date.;Short Term: Will quit all tobacco product use, adhering to prevention of relapse plan.;Long Term: Complete abstinence from all tobacco products for at least 12 months from quit date.    Lipids  Yes    Intervention  Provide education and support for participant on nutrition & aerobic/resistive exercise along with prescribed medications to achieve LDL '70mg'$ , HDL >'40mg'$ .    Expected Outcomes  Short Term: Participant states understanding of desired cholesterol values and is compliant with medications prescribed. Participant is following exercise prescription and nutrition guidelines.;Long Term: Cholesterol controlled with medications as prescribed, with individualized exercise RX and with personalized nutrition plan. Value goals: LDL < '70mg'$ , HDL > 40 mg.       Core Components/Risk  Factors/Patient Goals Review:  Goals and Risk Factor Review    Row Name 05/04/19 1518             Core Components/Risk Factors/Patient Goals Review   Personal Goals Review  Weight Management/Obesity;Tobacco Cessation       Review  Tyler Valencia looks forward to coming to [facility].  He has not been exercising at home.  He is taking meds as directed.  He has maintained not smoking even though he has had some urges.  He sees his Dr Friday.  He still has a bit of a cough possibly related to smoking and will talk to his Dr about it.       Expected Outcomes  Short - add in exercise at home Long - maintain heart healthy habits          Core Components/Risk Factors/Patient Goals at Discharge (Final Review):  Goals and Risk Factor Review - 05/04/19 1518      Core Components/Risk Factors/Patient Goals Review   Personal Goals Review  Weight Management/Obesity;Tobacco Cessation    Review  Tyler Valencia looks forward to coming to [facility].  He has not been exercising at home.  He is taking meds as directed.  He has maintained not smoking even though he has had some urges.  He sees his Dr Friday.  He still has a bit of a cough possibly related to smoking and will talk to his Dr about it.    Expected Outcomes  Short - add in exercise at home Long - maintain heart healthy habits  ITP Comments: ITP Comments    Row Name 03/25/19 1413 03/28/19 0909 03/30/19 1516 04/04/19 1332 04/04/19 1540   ITP Comments  Initial telephone orientation completed. Diagnosis can be found in [facility] 10/13. EP orientation scheduled for 11/9 at 8am  Completed 6MWT and gym orientation.  Initial ITP created and sent for review to Dr. Emily Filbert, Medical Director.  First full day of exercise!  Patient was oriented to gym and equipment including functions, settings, policies, and procedures.  Patient's individual exercise prescription and treatment plan were reviewed.  All starting workloads were established based on the results of the 6 minute walk test done at  initial orientation visit.  The plan for exercise progression was also introduced and progression will be customized based on patient's performance and goals.  completed initial RD eval  --   Row Name 04/20/19 [phone] 05/18/19 0838         ITP Comments  30 day review competed . ITP sent to Dr Emily Filbert for review, changes as needed and ITP approval signature.  30 day review competed . ITP sent to Dr Emily Filbert for review, changes as needed and ITP approval signature         Comments:

## 2019-05-20 HISTORY — PX: ASCENDING AORTIC ANEURYSM REPAIR: SHX1191

## 2019-05-23 ENCOUNTER — Other Ambulatory Visit: Payer: Self-pay

## 2019-05-23 ENCOUNTER — Encounter: Payer: PRIVATE HEALTH INSURANCE | Attending: Cardiology | Admitting: *Deleted

## 2019-05-23 DIAGNOSIS — Z7982 Long term (current) use of aspirin: Secondary | ICD-10-CM | POA: Insufficient documentation

## 2019-05-23 DIAGNOSIS — F17200 Nicotine dependence, unspecified, uncomplicated: Secondary | ICD-10-CM | POA: Insufficient documentation

## 2019-05-23 DIAGNOSIS — I4891 Unspecified atrial fibrillation: Secondary | ICD-10-CM | POA: Insufficient documentation

## 2019-05-23 DIAGNOSIS — E7800 Pure hypercholesterolemia, unspecified: Secondary | ICD-10-CM | POA: Insufficient documentation

## 2019-05-23 DIAGNOSIS — Z7901 Long term (current) use of anticoagulants: Secondary | ICD-10-CM | POA: Insufficient documentation

## 2019-05-23 DIAGNOSIS — I712 Thoracic aortic aneurysm, without rupture: Secondary | ICD-10-CM | POA: Insufficient documentation

## 2019-05-23 DIAGNOSIS — Z79899 Other long term (current) drug therapy: Secondary | ICD-10-CM | POA: Insufficient documentation

## 2019-05-23 DIAGNOSIS — Z8679 Personal history of other diseases of the circulatory system: Secondary | ICD-10-CM

## 2019-05-23 DIAGNOSIS — Z9889 Other specified postprocedural states: Secondary | ICD-10-CM | POA: Insufficient documentation

## 2019-05-23 NOTE — Progress Notes (Signed)
Virtual follow up completed. No in person session this week.

## 2019-05-30 ENCOUNTER — Ambulatory Visit: Payer: PRIVATE HEALTH INSURANCE

## 2019-06-06 ENCOUNTER — Encounter: Payer: PRIVATE HEALTH INSURANCE | Admitting: *Deleted

## 2019-06-06 ENCOUNTER — Other Ambulatory Visit: Payer: Self-pay

## 2019-06-06 DIAGNOSIS — Z9889 Other specified postprocedural states: Secondary | ICD-10-CM

## 2019-06-06 DIAGNOSIS — Z8679 Personal history of other diseases of the circulatory system: Secondary | ICD-10-CM

## 2019-06-06 NOTE — Progress Notes (Signed)
Virtual F/U call done today:  Tyler Valencia reports walking and stretching 1-2 days per week while not in cardiac rehab. He reports no new concerns or symptoms while exercising.   Tyler Valencia reports taking meds as prescribed.   Tyler Valencia reports no new stress or sleep concerns. He was able to get his CPAP hooked back up.

## 2019-06-13 ENCOUNTER — Ambulatory Visit: Payer: PRIVATE HEALTH INSURANCE

## 2019-06-14 ENCOUNTER — Encounter: Payer: Self-pay | Admitting: *Deleted

## 2019-06-14 ENCOUNTER — Telehealth: Payer: Self-pay | Admitting: *Deleted

## 2019-06-14 DIAGNOSIS — Z8679 Personal history of other diseases of the circulatory system: Secondary | ICD-10-CM

## 2019-06-14 DIAGNOSIS — Z9889 Other specified postprocedural states: Secondary | ICD-10-CM

## 2019-06-14 NOTE — Progress Notes (Signed)
Cardiac Individual Treatment Plan  Patient Details  Name: Tyler Valencia MRN: [id number] Date of Birth: [birth year] Referring Provider:     Cardiac Rehab from 03/28/2019 in [facility] Cardiac and Pulmonary Rehab  Referring Provider  Bartholome Bill MD      Initial Encounter Date:    Cardiac Rehab from 03/28/2019 in [facility] Cardiac and Pulmonary Rehab  Date  03/28/19      Visit Diagnosis: Status post ascending aortic aneurysm repair  Patient's Home Medications on Admission:  Current Outpatient Medications:  .  amiodarone (PACERONE) 200 MG tablet, Take by mouth., Disp: , Rfl:  .  aspirin EC 81 MG tablet, Take 81 mg by mouth daily., Disp: , Rfl:  .  ezetimibe (ZETIA) 10 MG tablet, Take 10 mg by mouth daily., Disp: , Rfl:  .  metoprolol tartrate (LOPRESSOR) 25 MG tablet, Take 25 mg by mouth 2 (two) times daily., Disp: , Rfl:  .  oxybutynin (DITROPAN) 5 MG tablet, Take by mouth., Disp: , Rfl:  .  sertraline (ZOLOFT) 25 MG tablet, Take 25 mg by mouth daily., Disp: , Rfl:  .  traZODone (DESYREL) 50 MG tablet, Take 50 mg by mouth at bedtime., Disp: , Rfl:  .  warfarin (COUMADIN) 4 MG tablet, Take 4 mg by mouth daily. Take in addition to 30m for a total of 952mdaily, Disp: , Rfl:  .  warfarin (COUMADIN) 5 MG tablet, Take 5 mg by mouth daily. Take in addition to 30m28mor a total of 9mg47mily, Disp: , Rfl:   Past Medical History: Past Medical History:  Diagnosis Date  . AAA (abdominal aortic aneurysm) (HCC)[place]. Atrial fibrillation (HCC)[place]. High cholesterol   . Status post mechanical aortic valve replacement     Tobacco Use: Social History   Tobacco Use  Smoking Status Current Every Day Smoker  . Packs/day: 0.50  Smokeless Tobacco Never Used    Labs: Recent Review Flowsheet Data    There is no flowsheet data to display.       Exercise Target Goals: Exercise Program Goal: Individual exercise prescription set using results from initial 6 min walk test and THRR while considering   patient's activity barriers and safety.   Exercise Prescription Goal: Initial exercise prescription builds to 30-45 minutes a day of aerobic activity, 2-3 days per week.  Home exercise guidelines will be given to patient during program as part of exercise prescription that the participant will acknowledge.  Activity Barriers & Risk Stratification: Activity Barriers & Cardiac Risk Stratification - 03/28/19 0911      Activity Barriers & Cardiac Risk Stratification   Activity Barriers  Deconditioning;Muscular Weakness;Balance Concerns    Cardiac Risk Stratification  High       6 Minute Walk: 6 Minute Walk    Row Name 03/28/19 0910         6 Minute Walk   Phase  Initial     Distance  1090 feet     Walk Time  6 minutes     # of Rest Breaks  0     MPH  3.23     METS  2.06     RPE  11     VO2 Peak  11.31     Symptoms  No     Resting HR  81 bpm     Resting BP  134/72     Resting Oxygen Saturation   97 %     Exercise Oxygen Saturation  during 6 min walk  98 %     Max Ex. HR  91 bpm     Max Ex. BP  146/74     2 Minute Post BP  104/66        Oxygen Initial Assessment:   Oxygen Re-Evaluation:   Oxygen Discharge (Final Oxygen Re-Evaluation):   Initial Exercise Prescription: Initial Exercise Prescription - 03/28/19 0900      Date of Initial Exercise RX and Referring Provider   Date  03/28/19    Referring Provider  Bartholome Bill MD      Treadmill   MPH  2    Grade  0.5    Minutes  15    METs  2.67      NuStep   Level  3    SPM  80    Minutes  15    METs  2      Arm Ergometer   Level  2    RPM  30    Minutes  15    METs  2      Recumbant Elliptical   Level  1    RPM  50    Minutes  15    METs  2      T5 Nustep   Level  3    SPM  80    Minutes  15    METs  2      Biostep-RELP   Level  3    SPM  50    Minutes  15    METs  2      Prescription Details   Frequency (times per week)  2    Duration  Progress to 30 minutes of continuous aerobic  without signs/symptoms of physical distress      Intensity   THRR 40-80% of Max Heartrate  112-143    Ratings of Perceived Exertion  11-13    Perceived Dyspnea  0-4      Progression   Progression  Continue to progress workloads to maintain intensity without signs/symptoms of physical distress.      Resistance Training   Training Prescription  Yes    Weight  3 lbs    Reps  10-15       Perform Capillary Blood Glucose checks as needed.  Exercise Prescription Changes: Exercise Prescription Changes    Row Name 03/28/19 0900 04/12/19 0800 04/13/19 1500 04/27/19 1000 05/10/19 1200     Response to Exercise   Blood Pressure (Admit)  134/72  100/58  -  102/62  102/62   Blood Pressure (Exercise)  146/74  134/66  -  128/64  128/62   Blood Pressure (Exit)  104/66  94/60  -  102/70  112/62   Heart Rate (Admit)  81 bpm  80 bpm  -  81 bpm  83 bpm   Heart Rate (Exercise)  91 bpm  102 bpm  -  100 bpm  101 bpm   Heart Rate (Exit)  89 bpm  81 bpm  -  77 bpm  85 bpm   Oxygen Saturation (Admit)  97 %  -  -  -  -   Oxygen Saturation (Exercise)  98 %  -  -  -  -   Rating of Perceived Exertion (Exercise)  11  13  -  13  13   Symptoms  none  none  -  none  none   Comments  walk test results  -  -  -  -  Duration  -  Continue with 30 min of aerobic exercise without signs/symptoms of physical distress.  -  Continue with 30 min of aerobic exercise without signs/symptoms of physical distress.  Continue with 30 min of aerobic exercise without signs/symptoms of physical distress.   Intensity  -  -  -  THRR unchanged  THRR unchanged     Progression   Progression  -  Continue to progress workloads to maintain intensity without signs/symptoms of physical distress.  -  Continue to progress workloads to maintain intensity without signs/symptoms of physical distress.  Continue to progress workloads to maintain intensity without signs/symptoms of physical distress.   Average METs  -  2.6  -  2.67  3.15      Resistance Training   Training Prescription  -  Yes  -  Yes  Yes   Weight  -  3 lb  -  3 lb  3 lb   Reps  -  10-15  -  10-15  10-15     Interval Training   Interval Training  -  No  -  No  No     Treadmill   MPH  -  2  -  2  -   Grade  -  0.5  -  0.5  -   Minutes  -  15  -  15  -   METs  -  2.67  -  2.67  -     NuStep   Level  -  -  -  4  4   SPM  -  -  -  -  80   Minutes  -  -  -  15  15   METs  -  -  -  3.5  3.2     Arm Ergometer   Level  -  -  -  2  -   Minutes  -  -  -  15  -   METs  -  -  -  3  -     Recumbant Elliptical   Level  -  -  -  3  4   Minutes  -  -  -  15  15   METs  -  -  -  2.2  3.2     T5 Nustep   Level  -  3  -  -  -   SPM  -  80  -  -  -   Minutes  -  15  -  -  -   METs  -  2.5  -  -  -     Biostep-RELP   Level  -  -  -  3  -   Minutes  -  -  -  15  -   METs  -  -  -  3  -     Home Exercise Plan   Plans to continue exercise at  -  -  Home (comment) walk at home and attend [facility] (comment) walk at home and attend [facility] (comment) walk at home and attend [facility]    Frequency  -  -  Add 1 additional day to program exercise sessions.  Add 1 additional day to program exercise sessions.  Add 1 additional day to program exercise sessions.   Initial Home Exercises Provided  -  -  04/13/19  04/13/19  04/13/19  [facility] Name 05/27/19 0900             Response to Exercise   Blood Pressure (Admit)  102/62       Blood Pressure (Exercise)  140/72       Blood Pressure (Exit)  108/70       Heart Rate (Admit)  56 bpm       Heart Rate (Exercise)  87 bpm       Heart Rate (Exit)  71 bpm       Rating of Perceived Exertion (Exercise)  13       Symptoms  none       Duration  Continue with 30 min of aerobic exercise without signs/symptoms of physical distress.       Intensity  THRR unchanged         Progression   Progression  Continue to progress workloads to maintain intensity without signs/symptoms of physical distress.       Average METs   2         Resistance Training   Training Prescription  Yes       Weight  3 lb       Reps  10-15         Interval Training   Interval Training  No         NuStep   Level  4       Minutes  15       METs  2         Arm Ergometer   Level  1       Minutes  15         Home Exercise Plan   Plans to continue exercise at  Home (comment) walk at home and attend [facility]        Frequency  Add 1 additional day to program exercise sessions.       Initial Home Exercises Provided  04/13/19          Exercise Comments: Exercise Comments    Row Name 03/30/19 1514 04/13/19 1527         Exercise Comments  First full day of exercise!  Patient was oriented to gym and equipment including functions, settings, policies, and procedures.  Patient's individual exercise prescription and treatment plan were reviewed.  All starting workloads were established based on the results of the 6 minute walk test done at initial orientation visit.  The plan for exercise progression was also introduced and progression will be customized based on patient's performance and goals.  Reviewed home exercise with pt today.  Pt plans to walk at home and attend [facility] for exercise.  Reviewed THR, pulse, RPE, sign and symptoms, NTG use, and when to call 911 or MD.  Also discussed weather considerations and indoor options.  Pt voiced understanding.         Exercise Goals and Review: Exercise Goals    Row Name 03/28/19 0916             Exercise Goals   Increase Physical Activity  Yes       Intervention  Provide advice, education, support and counseling about physical activity/exercise needs.;Develop an individualized exercise prescription for aerobic and resistive training based on initial evaluation findings, risk stratification, comorbidities and participant's personal goals.       Expected Outcomes  Short Term: Attend rehab on a regular basis to increase amount of physical activity.;Long Term: Add in home exercise  to make exercise part  of routine and to increase amount of physical activity.;Long Term: Exercising regularly at least 3-5 days a week.       Increase Strength and Stamina  Yes       Intervention  Provide advice, education, support and counseling about physical activity/exercise needs.;Develop an individualized exercise prescription for aerobic and resistive training based on initial evaluation findings, risk stratification, comorbidities and participant's personal goals.       Expected Outcomes  Short Term: Increase workloads from initial exercise prescription for resistance, speed, and METs.;Short Term: Perform resistance training exercises routinely during rehab and add in resistance training at home;Long Term: Improve cardiorespiratory fitness, muscular endurance and strength as measured by increased METs and functional capacity (6MWT)       Able to understand and use rate of perceived exertion (RPE) scale  Yes       Intervention  Provide education and explanation on how to use RPE scale       Expected Outcomes  Short Term: Able to use RPE daily in rehab to express subjective intensity level;Long Term:  Able to use RPE to guide intensity level when exercising independently       Knowledge and understanding of Target Heart Rate Range (THRR)  Yes       Intervention  Provide education and explanation of THRR including how the numbers were predicted and where they are located for reference       Expected Outcomes  Short Term: Able to state/look up THRR;Short Term: Able to use daily as guideline for intensity in rehab;Long Term: Able to use THRR to govern intensity when exercising independently       Able to check pulse independently  Yes       Intervention  Review the importance of being able to check your own pulse for safety during independent exercise;Provide education and demonstration on how to check pulse in carotid and radial arteries.       Expected Outcomes  Short Term: Able to explain why pulse  checking is important during independent exercise;Long Term: Able to check pulse independently and accurately       Understanding of Exercise Prescription  Yes       Intervention  Provide education, explanation, and written materials on patient's individual exercise prescription       Expected Outcomes  Short Term: Able to explain program exercise prescription;Long Term: Able to explain home exercise prescription to exercise independently          Exercise Goals Re-Evaluation : Exercise Goals Re-Evaluation    Row Name 03/30/19 1514 04/12/19 0830 04/13/19 1528 04/27/19 1052 05/09/19 1514     Exercise Goal Re-Evaluation   Exercise Goals Review  Increase Physical Activity;Knowledge and understanding of Target Heart Rate Range (THRR);Able to understand and use rate of perceived exertion (RPE) scale;Understanding of Exercise Prescription  Increase Physical Activity;Increase Strength and Stamina;Able to understand and use rate of perceived exertion (RPE) scale;Able to understand and use Dyspnea scale;Knowledge and understanding of Target Heart Rate Range (THRR);Understanding of Exercise Prescription;Able to check pulse independently  Increase Physical Activity;Increase Strength and Stamina;Able to understand and use rate of perceived exertion (RPE) scale;Able to understand and use Dyspnea scale;Knowledge and understanding of Target Heart Rate Range (THRR);Understanding of Exercise Prescription;Able to check pulse independently  Increase Physical Activity;Increase Strength and Stamina;Understanding of Exercise Prescription  Increase Strength and Stamina;Increase Physical Activity   Comments  Reviewed RPE scale, THR and program prescription with pt today.  Pt voiced understanding and was given a  copy of goals to take home.  Tyler Valencia has tolerated exercise well in his first weeks.  Staff will monitor progress.  Reviewed home exercise with pt today.  Pt plans to walk at home and attend [facility] for exercise.   Reviewed THR, pulse, RPE, sign and symptoms, NTG use, and when to call 911 or MD.  Also discussed weather considerations and indoor options.  Pt voiced understanding.  Tyler Valencia has been doing well in rehab.  He is now adding in the treadmill to his exercise routine. He is also on level 3 for the recumbent elliptical.  We will continue to monitor his progress.  Patient is not doing any exercise at home at the moment. He is going to walk his dog at home and possibly join the [place]. Gave patient information about the [facility] and how to join.   Expected Outcomes  Short: Use RPE daily to regulate intensity. Long: Follow program prescription in THR.  Short - attend HT consistently Long - increase stamina overall  Short: add one day a week walking at home. Long: increased overall stamina  Short: Increase workload on NuStep.  Long: Continue to improve stamina.  Short: add a day of walking at home. Long: Join the [place] and maintain an exercise routine independently.   [place] Name 05/10/19 1256 05/27/19 0912 06/06/19 1422         Exercise Goal Re-Evaluation   Exercise Goals Review  Increase Physical Activity;Increase Strength and Stamina;Able to understand and use rate of perceived exertion (RPE) scale;Knowledge and understanding of Target Heart Rate Range (THRR);Able to check pulse independently;Understanding of Exercise Prescription  Increase Physical Activity;Increase Strength and Stamina;Understanding of Exercise Prescription  Increase Physical Activity;Increase Strength and Stamina;Understanding of Exercise Prescription     Comments  -  Tyler Valencia has been doing well in rehab.  He is planning to join the [place] to continue to exericse on his own on off days.  He is up to level 4 on the NuStep.  We will continue to monitor his progress.  Tyler Valencia reports walking and stretching 1-2 days per week while not in cardiac rehab. He reports no new concerns or symptoms while exercising.     Expected Outcomes  -  Short: Find way  to exercise at home.  Long: Continue to build stamina.  Short: continue to talk at home, try walking at least 3 days per week. Long: Continue to build stamina.        Discharge Exercise Prescription (Final Exercise Prescription Changes): Exercise Prescription Changes - 05/27/19 0900      Response to Exercise   Blood Pressure (Admit)  102/62    Blood Pressure (Exercise)  140/72    Blood Pressure (Exit)  108/70    Heart Rate (Admit)  56 bpm    Heart Rate (Exercise)  87 bpm    Heart Rate (Exit)  71 bpm    Rating of Perceived Exertion (Exercise)  13    Symptoms  none    Duration  Continue with 30 min of aerobic exercise without signs/symptoms of physical distress.    Intensity  THRR unchanged      Progression   Progression  Continue to progress workloads to maintain intensity without signs/symptoms of physical distress.    Average METs  2      Resistance Training   Training Prescription  Yes    Weight  3 lb    Reps  10-15      Interval Training   Interval Training  No  NuStep   Level  4    Minutes  15    METs  2      Arm Ergometer   Level  1    Minutes  15      Home Exercise Plan   Plans to continue exercise at  Home (comment)   walk at home and attend [facility]    Frequency  Add 1 additional day to program exercise sessions.    Initial Home Exercises Provided  04/13/19       Nutrition:  Target Goals: Understanding of nutrition guidelines, daily intake of sodium <1542m, cholesterol <2057m calories 30% from fat and 7% or less from saturated fats, daily to have 5 or more servings of fruits and vegetables.  Biometrics: Pre Biometrics - 03/28/19 0917      Pre Biometrics   Height  5' 11.9" (1.826 m)    Weight  189 lb 4.8 oz (85.9 kg)    BMI (Calculated)  25.75    Single Leg Stand  13.25 seconds        Nutrition Therapy Plan and Nutrition Goals: Nutrition Therapy & Goals - 04/04/19 1302      Nutrition Therapy   Diet  HH, low Na diet    Drug/Food  Interactions  Coumadin/Vit K    Protein (specify units)  70g    Fiber  30 grams    Whole Grain Foods  3 servings    Saturated Fats  12 max. grams    Fruits and Vegetables  5 servings/day    Sodium  1.5 grams      Personal Nutrition Goals   Nutrition Goal  ST: add at least 1 fruit serving per day LT: regain balance, get back to pre-surgery condition, continue with no cigarettes - stopped mid October    Comments  Pt reports he was eating better when in the hospital. Now has yogurt (yoplait peach and strawberry) every morning with instant brown sugar maple;milk 2% and tangerine drink. Drinks 2 glasses (1-5 glasses) of milk per day. Pt reports thinking that he should drink more water (about 2 glasses currently). L: soup from [employer] or [employer] roast beef sandwich or burger king sandwich - no fries. D: last night: spaghetti (white) with meatballs and sauce. dinner varies. stoufers meatloaf and mashed potatoes and frozen vegetables. Beef is most prominant protein source, will have fresh fish or have chicken in frozen dinners. When cooks uses salt and vegetable oil w/ olive oil and butter. Frozen vegetables more so than fresh, will have about every other day (corn, brussell sprouts, etc. ). Pt reports does not normally snack, but will sometimes have chocolate or ice cream. discussed Hh eating.      Intervention Plan   Intervention  Prescribe, educate and counsel regarding individualized specific dietary modifications aiming towards targeted core components such as weight, hypertension, lipid management, diabetes, heart failure and other comorbidities.;Nutrition handout(s) given to patient.    Expected Outcomes  Short Term Goal: Understand basic principles of dietary content, such as calories, fat, sodium, cholesterol and nutrients.;Short Term Goal: A plan has been developed with personal nutrition goals set during dietitian appointment.;Long Term Goal: Adherence to prescribed nutrition plan.        Nutrition Assessments: Nutrition Assessments - 03/28/19 0924      MEDFICTS Scores   Pre Score  103       Nutrition Goals Re-Evaluation: Nutrition Goals Re-Evaluation    [place] 04/25/19 1551  Goals   Nutrition Goal  ST: going to the grocery store LT: regain balance, get back to pre-surgery condition, continue with no cigarettes - stopped mid October       Comment  Pt reports not being so regimented, now missing meals - missed B and L today. Pt reports its getting to the grocery store and son is home right now so it is hard to maintain. talked about some quick meals he could have on hand.       Expected Outcome  ST: going to the grocery store LT: regain balance, get back to pre-surgery condition, continue with no cigarettes - stopped mid October          Nutrition Goals Discharge (Final Nutrition Goals Re-Evaluation): Nutrition Goals Re-Evaluation - 04/25/19 1551      Goals   Nutrition Goal  ST: going to the grocery store LT: regain balance, get back to pre-surgery condition, continue with no cigarettes - stopped mid October    Comment  Pt reports not being so regimented, now missing meals - missed B and L today. Pt reports its getting to the grocery store and son is home right now so it is hard to maintain. talked about some quick meals he could have on hand.    Expected Outcome  ST: going to the grocery store LT: regain balance, get back to pre-surgery condition, continue with no cigarettes - stopped mid October       Psychosocial: Target Goals: Acknowledge presence or absence of significant depression and/or stress, maximize coping skills, provide positive support system. Participant is able to verbalize types and ability to use techniques and skills needed for reducing stress and depression.   Initial Review & Psychosocial Screening: Initial Psych Review & Screening - 03/25/19 1407      Initial Review   Current issues with  None Identified      Family  Dynamics   Good Support System?  Yes      Barriers   Psychosocial barriers to participate in program  There are no identifiable barriers or psychosocial needs.      Screening Interventions   Interventions  Encouraged to exercise;To provide support and resources with identified psychosocial needs    Expected Outcomes  Short Term goal: Utilizing psychosocial counselor, staff and physician to assist with identification of specific Stressors or current issues interfering with healing process. Setting desired goal for each stressor or current issue identified.;Long Term Goal: Stressors or current issues are controlled or eliminated.;Short Term goal: Identification and review with participant of any Quality of Life or Depression concerns found by scoring the questionnaire.;Long Term goal: The participant improves quality of Life and PHQ9 Scores as seen by post scores and/or verbalization of changes       Quality of Life Scores:  Quality of Life - 03/28/19 0923      Quality of Life   Select  Quality of Life      Quality of Life Scores   Health/Function Pre  22.33 %    Socioeconomic Pre  20.86 %    Psych/Spiritual Pre  21.57 %    Family Pre  24.9 %    GLOBAL Pre  22.25 %      Scores of 19 and below usually indicate a poorer quality of life in these areas.  A difference of  2-3 points is a clinically meaningful difference.  A difference of 2-3 points in the total score of the Quality of Life Index has been associated with  significant improvement in overall quality of life, self-image, physical symptoms, and general health in studies assessing change in quality of life.  PHQ-9: Recent Review Flowsheet Data    Depression screen [facility] 2/9 05/04/2019 03/28/2019   Decreased Interest 1 1   Down, Depressed, Hopeless 1 0   PHQ - 2 Score 2 1   Altered sleeping 3 1   Tired, decreased energy 1 1   Change in appetite 1 0   Feeling bad or failure about yourself  1 1   Trouble concentrating 0 0   Moving  slowly or fidgety/restless 0 1   Suicidal thoughts 0 0   PHQ-9 Score 8 5   Difficult doing work/chores Somewhat difficult Not difficult at all     Interpretation of Total Score  Total Score Depression Severity:  1-4 = Minimal depression, 5-9 = Mild depression, 10-14 = Moderate depression, 15-19 = Moderately severe depression, 20-27 = Severe depression   Psychosocial Evaluation and Intervention: Psychosocial Evaluation - 03/25/19 1409      Psychosocial Evaluation & Interventions   Comments  Cloy reports feeling pretty well after his surgery. His wife is supportive and he has been staying with her so he feels well taken care of. He is looking forward to coming to cardiac rehab to gain independence in exercise and education about a heart healthy lifestyle    Expected Outcomes  Short: attend Cardiac Rehab. Long: independence with self care habits.       Psychosocial Re-Evaluation: Psychosocial Re-Evaluation    [place] Name 05/04/19 1525 06/06/19 1424           Psychosocial Re-Evaluation   Current issues with  Current Stress Concerns;Current Sleep Concerns  Current Stress Concerns;Current Sleep Concerns      Comments  Tyler Valencia states his stress is with his son who is 3 but it is manageable.  He is having some issues sleeping.  He feels he needs to get his CPAP hooked back up.  He has been sleeping on the couch beacuse he can elevate his head easier.  Tyler Valencia reports no new stress or sleep concerns. He was able to get his CPAP hooked back up.      Expected Outcomes  Short - get CPAP hooked back up and sleep in the bed Long - develop better sleep patterns  Short: exercise to help control stress and sleep patterns. Long: develop better long term sleep patterns.      Interventions  -  Encouraged to attend Cardiac Rehabilitation for the exercise      Continue Psychosocial Services   -  Follow up required by staff         Psychosocial Discharge (Final Psychosocial Re-Evaluation): Psychosocial  Re-Evaluation - 06/06/19 1424      Psychosocial Re-Evaluation   Current issues with  Current Stress Concerns;Current Sleep Concerns    Comments  Tyler Valencia reports no new stress or sleep concerns. He was able to get his CPAP hooked back up.    Expected Outcomes  Short: exercise to help control stress and sleep patterns. Long: develop better long term sleep patterns.    Interventions  Encouraged to attend Cardiac Rehabilitation for the exercise    Continue Psychosocial Services   Follow up required by staff       Vocational Rehabilitation: Provide vocational rehab assistance to qualifying candidates.   Vocational Rehab Evaluation & Intervention: Vocational Rehab - 03/25/19 1406      Initial Vocational Rehab Evaluation & Intervention   Assessment shows need for  Vocational Rehabilitation  No       Education: Education Goals: Education classes will be provided on a variety of topics geared toward better understanding of heart health and risk factor modification. Participant will state understanding/return demonstration of topics presented as noted by education test scores.  Learning Barriers/Preferences: Learning Barriers/Preferences - 03/25/19 1406      Learning Barriers/Preferences   Learning Barriers  None    Learning Preferences  None       Education Topics:  AED/CPR: - Group verbal and written instruction with the use of models to demonstrate the basic use of the AED with the basic ABC's of resuscitation.   General Nutrition Guidelines/Fats and Fiber: -Group instruction provided by verbal, written material, models and posters to present the general guidelines for heart healthy nutrition. Gives an explanation and review of dietary fats and fiber.   Cardiac Rehab from 04/07/2019 in [facility] Cardiac and Pulmonary Rehab  Date  04/07/19  Educator  [facility]  Instruction Review Code  1- Verbalizes Understanding      Controlling Sodium/Reading Food Labels: -Group verbal and written  material supporting the discussion of sodium use in heart healthy nutrition. Review and explanation with models, verbal and written materials for utilization of the food label.   Exercise Physiology & General Exercise Guidelines: - Group verbal and written instruction with models to review the exercise physiology of the cardiovascular system and associated critical values. Provides general exercise guidelines with specific guidelines to those with heart or lung disease.    Aerobic Exercise & Resistance Training: - Gives group verbal and written instruction on the various components of exercise. Focuses on aerobic and resistive training programs and the benefits of this training and how to safely progress through these programs..   Flexibility, Balance, Mind/Body Relaxation: Provides group verbal/written instruction on the benefits of flexibility and balance training, including mind/body exercise modes such as yoga, pilates and tai chi.  Demonstration and skill practice provided.   Cardiac Rehab from 04/07/2019 in [facility] Cardiac and Pulmonary Rehab  Date  04/07/19  Educator  as  Instruction Review Code  1- Verbalizes Understanding      Stress and Anxiety: - Provides group verbal and written instruction about the health risks of elevated stress and causes of high stress.  Discuss the correlation between heart/lung disease and anxiety and treatment options. Review healthy ways to manage with stress and anxiety.   Depression: - Provides group verbal and written instruction on the correlation between heart/lung disease and depressed mood, treatment options, and the stigmas associated with seeking treatment.   Anatomy & Physiology of the Heart: - Group verbal and written instruction and models provide basic cardiac anatomy and physiology, with the coronary electrical and arterial systems. Review of Valvular disease and Heart Failure   Cardiac Procedures: - Group verbal and written  instruction to review commonly prescribed medications for heart disease. Reviews the medication, class of the drug, and side effects. Includes the steps to properly store meds and maintain the prescription regimen. (beta blockers and nitrates)   Cardiac Medications I: - Group verbal and written instruction to review commonly prescribed medications for heart disease. Reviews the medication, class of the drug, and side effects. Includes the steps to properly store meds and maintain the prescription regimen.   Cardiac Medications II: -Group verbal and written instruction to review commonly prescribed medications for heart disease. Reviews the medication, class of the drug, and side effects. (all other drug classes)    Go Sex-Intimacy & Heart  Disease, Get SMART - Goal Setting: - Group verbal and written instruction through game format to discuss heart disease and the return to sexual intimacy. Provides group verbal and written material to discuss and apply goal setting through the application of the S.M.A.R.T. Method.   Other Matters of the Heart: - Provides group verbal, written materials and models to describe Stable Angina and Peripheral Artery. Includes description of the disease process and treatment options available to the cardiac patient.   Exercise & Equipment Safety: - Individual verbal instruction and demonstration of equipment use and safety with use of the equipment.   Cardiac Rehab from 04/07/2019 in [facility] Cardiac and Pulmonary Rehab  Date  03/28/19  Educator  [facility]  Instruction Review Code  1- Verbalizes Understanding      Infection Prevention: - Provides verbal and written material to individual with discussion of infection control including proper hand washing and proper equipment cleaning during exercise session.   Cardiac Rehab from 04/07/2019 in [facility] Cardiac and Pulmonary Rehab  Date  03/28/19  Educator  [facility]  Instruction Review Code  1- Verbalizes Understanding       Falls Prevention: - Provides verbal and written material to individual with discussion of falls prevention and safety.   Cardiac Rehab from 04/07/2019 in [facility] Cardiac and Pulmonary Rehab  Date  03/28/19  Educator  [facility]  Instruction Review Code  1- Verbalizes Understanding      Diabetes: - Individual verbal and written instruction to review signs/symptoms of diabetes, desired ranges of glucose level fasting, after meals and with exercise. Acknowledge that pre and post exercise glucose checks will be done for 3 sessions at entry of program.   Know Your Numbers and Risk Factors: -Group verbal and written instruction about important numbers in your health.  Discussion of what are risk factors and how they play a role in the disease process.  Review of Cholesterol, Blood Pressure, Diabetes, and BMI and the role they play in your overall health.   Sleep Hygiene: -Provides group verbal and written instruction about how sleep can affect your health.  Define sleep hygiene, discuss sleep cycles and impact of sleep habits. Review good sleep hygiene tips.    Other: -Provides group and verbal instruction on various topics (see comments)   Knowledge Questionnaire Score: Knowledge Questionnaire Score - 03/28/19 0924      Knowledge Questionnaire Score   Pre Score  24/26 Education: Nutrition and Exercise       Core Components/Risk Factors/Patient Goals at Admission: Personal Goals and Risk Factors at Admission - 03/28/19 0927      Core Components/Risk Factors/Patient Goals on Admission    Weight Management  Yes;Weight Loss    Intervention  Weight Management: Develop a combined nutrition and exercise program designed to reach desired caloric intake, while maintaining appropriate intake of nutrient and fiber, sodium and fats, and appropriate energy expenditure required for the weight goal.;Weight Management: Provide education and appropriate resources to help participant work on and attain  dietary goals.    Admit Weight  189 lb 4.8 oz (85.9 kg)    Goal Weight: Short Term  184 lb (83.5 kg)    Goal Weight: Long Term  179 lb (81.2 kg)    Expected Outcomes  Short Term: Continue to assess and modify interventions until short term weight is achieved;Long Term: Adherence to nutrition and physical activity/exercise program aimed toward attainment of established weight goal;Weight Loss: Understanding of general recommendations for a balanced deficit meal plan, which promotes 1-2 lb  weight loss per week and includes a negative energy balance of [phone] kcal/d;Understanding recommendations for meals to include 15-35% energy as protein, 25-35% energy from fat, 35-60% energy from carbohydrates, less than 250m of dietary cholesterol, 20-35 gm of total fiber daily;Understanding of distribution of calorie intake throughout the day with the consumption of 4-5 meals/snacks    Tobacco Cessation  Yes    Number of packs per day  0.5    Intervention  Assist the participant in steps to quit. Provide individualized education and counseling about committing to Tobacco Cessation, relapse prevention, and pharmacological support that can be provided by physician.;OAdvice worker assist with locating and accessing local/national Quit Smoking programs, and support quit date choice.    Expected Outcomes  Short Term: Will demonstrate readiness to quit, by selecting a quit date.;Short Term: Will quit all tobacco product use, adhering to prevention of relapse plan.;Long Term: Complete abstinence from all tobacco products for at least 12 months from quit date.    Lipids  Yes    Intervention  Provide education and support for participant on nutrition & aerobic/resistive exercise along with prescribed medications to achieve LDL <76m HDL >40105m   Expected Outcomes  Short Term: Participant states understanding of desired cholesterol values and is compliant with medications prescribed. Participant is following  exercise prescription and nutrition guidelines.;Long Term: Cholesterol controlled with medications as prescribed, with individualized exercise RX and with personalized nutrition plan. Value goals: LDL < 55m41mDL > 40 mg.       Core Components/Risk Factors/Patient Goals Review:  Goals and Risk Factor Review    Row Name 05/04/19 1518 06/06/19 1400 06/06/19 1423         Core Components/Risk Factors/Patient Goals Review   Personal Goals Review  Weight Management/Obesity;Tobacco Cessation  Weight Management/Obesity;Tobacco Cessation  Weight Management/Obesity;Tobacco Cessation     Review  Tyler Valencia forward to coming to [facility].  He has not been exercising at home.  He is taking meds as directed.  He has maintained not smoking even though he has had some urges.  He sees his Dr Friday.  He still has a bit of a cough possibly related to smoking and will talk to his Dr about it.  -  Tyler Valencia reports taking meds as prescribed. He reports that he is still tobacco free.     Expected Outcomes  Short - add in exercise at home Long - maintain heart healthy habits  -  Short - continue to take meds and stay tobacco free.  Long - maintain heart healthy habits        Core Components/Risk Factors/Patient Goals at Discharge (Final Review):  Goals and Risk Factor Review - 06/06/19 1423      Core Components/Risk Factors/Patient Goals Review   Personal Goals Review  Weight Management/Obesity;Tobacco Cessation    Review  Tyler Valencia reports taking meds as prescribed. He reports that he is still tobacco free.    Expected Outcomes  Short - continue to take meds and stay tobacco free.  Long - maintain heart healthy habits       ITP Comments: ITP Comments    Row Name 03/25/19 1413 03/28/19 0909 03/30/19 1516 04/04/19 1332 04/04/19 1540   ITP Comments  Initial telephone orientation completed. Diagnosis can be found in CE 10/13. EP orientation scheduled for 11/9 at 8am  Completed 6MWT and gym orientation.  Initial ITP created and  sent for review to Dr. MarkEmily Filbertdical Director.  First full day of exercise!  Patient  was oriented to gym and equipment including functions, settings, policies, and procedures.  Patient's individual exercise prescription and treatment plan were reviewed.  All starting workloads were established based on the results of the 6 minute walk test done at initial orientation visit.  The plan for exercise progression was also introduced and progression will be customized based on patient's performance and goals.  completed initial RD eval  -   Row Name 04/20/19 5520 05/18/19 0838 05/23/19 1407 06/06/19 1421 06/08/19 1138   ITP Comments  30 day review competed . ITP sent to Dr Emily Filbert for review, changes as needed and ITP approval signature.  30 day review competed . ITP sent to Dr Emily Filbert for review, changes as needed and ITP approval signature  Virtual follow up completed. Checked on patient as he is not coming in person this week. Patient reported doing well. He plans to stay active this week but its hard not having set time and place. Explored some options over the phone and he is going to think about which one will work best for him. He will let us know on Monday when we see him again.  Virtual F/U call done today:Tyler Valencia reports walking and stretching 1-2 days per week while not in cardiac rehab. He reports no new concerns or symptoms while exercising. Tyler Valencia reports taking meds as prescribed. Tyler Valencia reports no new stress or sleep concerns. He was able to get his CPAP hooked back up.  Tyler Valencia has not attended in person since last review.   Tyler Valencia Name 06/14/19 1208           ITP Comments  Called to check on patient. He is having difficulty getting to class.  He is also going to need to leave state for a while to care for his father who now has COVID-19.  He would like to discharge at this time.          Comments: Discharge ITP

## 2019-06-14 NOTE — Progress Notes (Signed)
Discharge Progress Report  Patient Details  Name: Tyler Valencia MRN: [id number] Date of Birth: 1955/12/29 Referring Provider:     Cardiac Rehab from 03/28/2019 in [facility]  Referring Provider  Bartholome Bill MD       Number of Visits: 15  Reason for Discharge:  Early Exit:  Personal  Smoking History:  Social History   Tobacco Use  Smoking Status Current Every Day Smoker  . Packs/day: 0.50  Smokeless Tobacco Never Used    Diagnosis:  Status post ascending aortic aneurysm repair  ADL UCSD:   Initial Exercise Prescription: Initial Exercise Prescription - 03/28/19 0900      Date of Initial Exercise RX and Referring Provider   Date  03/28/19    Referring Provider  Bartholome Bill MD      Treadmill   MPH  2    Grade  0.5    Minutes  15    METs  2.67      NuStep   Level  3    SPM  80    Minutes  15    METs  2      Arm Ergometer   Level  2    RPM  30    Minutes  15    METs  2      Recumbant Elliptical   Level  1    RPM  50    Minutes  15    METs  2      T5 Nustep   Level  3    SPM  80    Minutes  15    METs  2      Biostep-RELP   Level  3    SPM  50    Minutes  15    METs  2      Prescription Details   Frequency (times per week)  2    Duration  Progress to 30 minutes of continuous aerobic without signs/symptoms of physical distress      Intensity   THRR 40-80% of Max Heartrate  112-143    Ratings of Perceived Exertion  11-13    Perceived Dyspnea  0-4      Progression   Progression  Continue to progress workloads to maintain intensity without signs/symptoms of physical distress.      Resistance Training   Training Prescription  Yes    Weight  3 lbs    Reps  10-15       Discharge Exercise Prescription (Final Exercise Prescription Changes): Exercise Prescription Changes - 05/27/19 0900      Response to Exercise   Blood Pressure (Admit)  102/62    Blood Pressure (Exercise)  140/72    Blood Pressure (Exit)   108/70    Heart Rate (Admit)  56 bpm    Heart Rate (Exercise)  87 bpm    Heart Rate (Exit)  71 bpm    Rating of Perceived Exertion (Exercise)  13    Symptoms  none    Duration  Continue with 30 min of aerobic exercise without signs/symptoms of physical distress.    Intensity  THRR unchanged      Progression   Progression  Continue to progress workloads to maintain intensity without signs/symptoms of physical distress.    Average METs  2      Resistance Training   Training Prescription  Yes    Weight  3 lb    Reps  10-15  Interval Training   Interval Training  No      NuStep   Level  4    Minutes  15    METs  2      Arm Ergometer   Level  1    Minutes  15      Home Exercise Plan   Plans to continue exercise at  Home (comment)   walk at home and attend [facility]    Frequency  Add 1 additional day to program exercise sessions.    Initial Home Exercises Provided  04/13/19       Functional Capacity: 6 Minute Walk    Row Name 03/28/19 0910         6 Minute Walk   Phase  Initial     Distance  1090 feet     Walk Time  6 minutes     # of Rest Breaks  0     MPH  3.23     METS  2.06     RPE  11     VO2 Peak  11.31     Symptoms  No     Resting HR  81 bpm     Resting BP  134/72     Resting Oxygen Saturation   97 %     Exercise Oxygen Saturation  during 6 min walk  98 %     Max Ex. HR  91 bpm     Max Ex. BP  146/74     2 Minute Post BP  104/66        Psychological, QOL, Others - Outcomes: PHQ 2/9: Depression screen [facility] 2/9 05/04/2019 03/28/2019  Decreased Interest 1 1  Down, Depressed, Hopeless 1 0  PHQ - 2 Score 2 1  Altered sleeping 3 1  Tired, decreased energy 1 1  Change in appetite 1 0  Feeling bad or failure about yourself  1 1  Trouble concentrating 0 0  Moving slowly or fidgety/restless 0 1  Suicidal thoughts 0 0  PHQ-9 Score 8 5  Difficult doing work/chores Somewhat difficult Not difficult at all    Quality of Life: Quality of Life -  03/28/19 0923      Quality of Life   Select  Quality of Life      Quality of Life Scores   Health/Function Pre  22.33 %    Socioeconomic Pre  20.86 %    Psych/Spiritual Pre  21.57 %    Family Pre  24.9 %    GLOBAL Pre  22.25 %       Personal Goals: Goals established at orientation with interventions provided to work toward goal. Personal Goals and Risk Factors at Admission - 03/28/19 0927      Core Components/Risk Factors/Patient Goals on Admission    Weight Management  Yes;Weight Loss    Intervention  Weight Management: Develop a combined nutrition and exercise program designed to reach desired caloric intake, while maintaining appropriate intake of nutrient and fiber, sodium and fats, and appropriate energy expenditure required for the weight goal.;Weight Management: Provide education and appropriate resources to help participant work on and attain dietary goals.    Admit Weight  189 lb 4.8 oz (85.9 kg)    Goal Weight: Short Term  184 lb (83.5 kg)    Goal Weight: Long Term  179 lb (81.2 kg)    Expected Outcomes  Short Term: Continue to assess and modify interventions until short term weight is achieved;Long Term: Adherence  to nutrition and physical activity/exercise program aimed toward attainment of established weight goal;Weight Loss: Understanding of general recommendations for a balanced deficit meal plan, which promotes 1-2 lb weight loss per week and includes a negative energy balance of [phone] kcal/d;Understanding recommendations for meals to include 15-35% energy as protein, 25-35% energy from fat, 35-60% energy from carbohydrates, less than 200mg  of dietary cholesterol, 20-35 gm of total fiber daily;Understanding of distribution of calorie intake throughout the day with the consumption of 4-5 meals/snacks    Tobacco Cessation  Yes    Number of packs per day  0.5    Intervention  Assist the participant in steps to quit. Provide individualized education and counseling about  committing to Tobacco Cessation, relapse prevention, and pharmacological support that can be provided by physician.;Advice worker, assist with locating and accessing local/national Quit Smoking programs, and support quit date choice.    Expected Outcomes  Short Term: Will demonstrate readiness to quit, by selecting a quit date.;Short Term: Will quit all tobacco product use, adhering to prevention of relapse plan.;Long Term: Complete abstinence from all tobacco products for at least 12 months from quit date.    Lipids  Yes    Intervention  Provide education and support for participant on nutrition & aerobic/resistive exercise along with prescribed medications to achieve LDL 70mg , HDL >40mg .    Expected Outcomes  Short Term: Participant states understanding of desired cholesterol values and is compliant with medications prescribed. Participant is following exercise prescription and nutrition guidelines.;Long Term: Cholesterol controlled with medications as prescribed, with individualized exercise RX and with personalized nutrition plan. Value goals: LDL < 70mg , HDL > 40 mg.        Personal Goals Discharge: Goals and Risk Factor Review    Row Name 05/04/19 1518 06/06/19 1400 06/06/19 1423         Core Components/Risk Factors/Patient Goals Review   Personal Goals Review  Weight Management/Obesity;Tobacco Cessation  Weight Management/Obesity;Tobacco Cessation  Weight Management/Obesity;Tobacco Cessation     Review  Coty looks forward to coming to [facility].  He has not been exercising at home.  He is taking meds as directed.  He has maintained not smoking even though he has had some urges.  He sees his Dr Friday.  He still has a bit of a cough possibly related to smoking and will talk to his Dr about it.  --  Sharon reports taking meds as prescribed. He reports that he is still tobacco free.     Expected Outcomes  Short - add in exercise at home Long - maintain heart healthy habits  --  Short -  continue to take meds and stay tobacco free.  Long - maintain heart healthy habits        Exercise Goals and Review: Exercise Goals    Row Name 03/28/19 0916             Exercise Goals   Increase Physical Activity  Yes       Intervention  Provide advice, education, support and counseling about physical activity/exercise needs.;Develop an individualized exercise prescription for aerobic and resistive training based on initial evaluation findings, risk stratification, comorbidities and participant's personal goals.       Expected Outcomes  Short Term: Attend rehab on a regular basis to increase amount of physical activity.;Long Term: Add in home exercise to make exercise part of routine and to increase amount of physical activity.;Long Term: Exercising regularly at least 3-5 days a week.  Increase Strength and Stamina  Yes       Intervention  Provide advice, education, support and counseling about physical activity/exercise needs.;Develop an individualized exercise prescription for aerobic and resistive training based on initial evaluation findings, risk stratification, comorbidities and participant's personal goals.       Expected Outcomes  Short Term: Increase workloads from initial exercise prescription for resistance, speed, and METs.;Short Term: Perform resistance training exercises routinely during rehab and add in resistance training at home;Long Term: Improve cardiorespiratory fitness, muscular endurance and strength as measured by increased METs and functional capacity (6MWT)       Able to understand and use rate of perceived exertion (RPE) scale  Yes       Intervention  Provide education and explanation on how to use RPE scale       Expected Outcomes  Short Term: Able to use RPE daily in rehab to express subjective intensity level;Long Term:  Able to use RPE to guide intensity level when exercising independently       Knowledge and understanding of Target Heart Rate Range (THRR)   Yes       Intervention  Provide education and explanation of THRR including how the numbers were predicted and where they are located for reference       Expected Outcomes  Short Term: Able to state/look up THRR;Short Term: Able to use daily as guideline for intensity in rehab;Long Term: Able to use THRR to govern intensity when exercising independently       Able to check pulse independently  Yes       Intervention  Review the importance of being able to check your own pulse for safety during independent exercise;Provide education and demonstration on how to check pulse in carotid and radial arteries.       Expected Outcomes  Short Term: Able to explain why pulse checking is important during independent exercise;Long Term: Able to check pulse independently and accurately       Understanding of Exercise Prescription  Yes       Intervention  Provide education, explanation, and written materials on patient's individual exercise prescription       Expected Outcomes  Short Term: Able to explain program exercise prescription;Long Term: Able to explain home exercise prescription to exercise independently          Exercise Goals Re-Evaluation: Exercise Goals Re-Evaluation    Row Name 03/30/19 1514 04/12/19 0830 04/13/19 1528 04/27/19 1052 05/09/19 1514     Exercise Goal Re-Evaluation   Exercise Goals Review  Increase Physical Activity;Knowledge and understanding of Target Heart Rate Range (THRR);Able to understand and use rate of perceived exertion (RPE) scale;Understanding of Exercise Prescription  Increase Physical Activity;Increase Strength and Stamina;Able to understand and use rate of perceived exertion (RPE) scale;Able to understand and use Dyspnea scale;Knowledge and understanding of Target Heart Rate Range (THRR);Understanding of Exercise Prescription;Able to check pulse independently  Increase Physical Activity;Increase Strength and Stamina;Able to understand and use rate of perceived exertion  (RPE) scale;Able to understand and use Dyspnea scale;Knowledge and understanding of Target Heart Rate Range (THRR);Understanding of Exercise Prescription;Able to check pulse independently  Increase Physical Activity;Increase Strength and Stamina;Understanding of Exercise Prescription  Increase Strength and Stamina;Increase Physical Activity   Comments  Reviewed RPE scale, THR and program prescription with pt today.  Pt voiced understanding and was given a copy of goals to take home.  Cochise has tolerated exercise well in his first weeks.  Staff will monitor progress.  Reviewed home  exercise with pt today.  Pt plans to walk at home and attend [facility] for exercise.  Reviewed THR, pulse, RPE, sign and symptoms, NTG use, and when to call 911 or MD.  Also discussed weather considerations and indoor options.  Pt voiced understanding.  Herman has been doing well in rehab.  He is now adding in the treadmill to his exercise routine. He is also on level 3 for the recumbent elliptical.  We will continue to monitor his progress.  Patient is not doing any exercise at home at the moment. He is going to walk his dog at home and possibly join the [place]. Gave patient information about the [facility] and how to join.   Expected Outcomes  Short: Use RPE daily to regulate intensity. Long: Follow program prescription in THR.  Short - attend HT consistently Long - increase stamina overall  Short: add one day a week walking at home. Long: increased overall stamina  Short: Increase workload on NuStep.  Long: Continue to improve stamina.  Short: add a day of walking at home. Long: Join the [place] and maintain an exercise routine independently.   [place] Name 05/10/19 1256 05/27/19 0912 06/06/19 1422         Exercise Goal Re-Evaluation   Exercise Goals Review  Increase Physical Activity;Increase Strength and Stamina;Able to understand and use rate of perceived exertion (RPE) scale;Knowledge and understanding of Target Heart Rate Range  (THRR);Able to check pulse independently;Understanding of Exercise Prescription  Increase Physical Activity;Increase Strength and Stamina;Understanding of Exercise Prescription  Increase Physical Activity;Increase Strength and Stamina;Understanding of Exercise Prescription     Comments  --  Kristen has been doing well in rehab.  He is planning to join the [facility] to continue to exericse on his own on off days.  He is up to level 4 on the NuStep.  We will continue to monitor his progress.  Lydell reports walking and stretching 1-2 days per week while not in cardiac rehab. He reports no new concerns or symptoms while exercising.     Expected Outcomes  --  Short: Find way to exercise at home.  Long: Continue to build stamina.  Short: continue to talk at home, try walking at least 3 days per week. Long: Continue to build stamina.        Nutrition & Weight - Outcomes: Pre Biometrics - 03/28/19 0917      Pre Biometrics   Height  5' 11.9" (1.826 m)    Weight  189 lb 4.8 oz (85.9 kg)    BMI (Calculated)  25.75    Single Leg Stand  13.25 seconds        Nutrition: Nutrition Therapy & Goals - 04/04/19 1302      Nutrition Therapy   Diet  HH, low Na diet    Drug/Food Interactions  Coumadin/Vit K    Protein (specify units)  70g    Fiber  30 grams    Whole Grain Foods  3 servings    Saturated Fats  12 max. grams    Fruits and Vegetables  5 servings/day    Sodium  1.5 grams      Personal Nutrition Goals   Nutrition Goal  ST: add at least 1 fruit serving per day LT: regain balance, get back to pre-surgery condition, continue with no cigarettes - stopped mid October    Comments  Pt reports he was eating better when in the hospital. Now has yogurt (yoplait peach and strawberry) every morning with instant brown  sugar maple;milk 2% and tangerine drink. Drinks 2 glasses (1-5 glasses) of milk per day. Pt reports thinking that he should drink more water (about 2 glasses currently). L: soup from [employer] or  [employer] roast beef sandwich or burger king sandwich - no fries. D: last night: spaghetti (white) with meatballs and sauce. dinner varies. stoufers meatloaf and mashed potatoes and frozen vegetables. Beef is most prominant protein source, will have fresh fish or have chicken in frozen dinners. When cooks uses salt and vegetable oil w/ olive oil and butter. Frozen vegetables more so than fresh, will have about every other day (corn, brussell sprouts, etc. ). Pt reports does not normally snack, but will sometimes have chocolate or ice cream. discussed Hh eating.      Intervention Plan   Intervention  Prescribe, educate and counsel regarding individualized specific dietary modifications aiming towards targeted core components such as weight, hypertension, lipid management, diabetes, heart failure and other comorbidities.;Nutrition handout(s) given to patient.    Expected Outcomes  Short Term Goal: Understand basic principles of dietary content, such as calories, fat, sodium, cholesterol and nutrients.;Short Term Goal: A plan has been developed with personal nutrition goals set during dietitian appointment.;Long Term Goal: Adherence to prescribed nutrition plan.       Nutrition Discharge: Nutrition Assessments - 03/28/19 0924      MEDFICTS Scores   Pre Score  103       Education Questionnaire Score: Knowledge Questionnaire Score - 03/28/19 0924      Knowledge Questionnaire Score   Pre Score  24/26 Education: Nutrition and Exercise       Goals reviewed with patient; copy given to patient.

## 2019-06-14 NOTE — Telephone Encounter (Signed)
Called to check on patient. He is having difficulty getting to class.  He is also going to need to leave state for a while to care for his father who now has COVID-19.  He would like to discharge at this time.

## 2019-06-27 ENCOUNTER — Ambulatory Visit: Payer: PRIVATE HEALTH INSURANCE

## 2019-07-11 ENCOUNTER — Ambulatory Visit: Payer: PRIVATE HEALTH INSURANCE

## 2019-07-26 ENCOUNTER — Other Ambulatory Visit: Payer: Self-pay | Admitting: Rehabilitative and Restorative Service Providers"

## 2019-08-29 ENCOUNTER — Emergency Department: Payer: PRIVATE HEALTH INSURANCE

## 2019-08-29 ENCOUNTER — Other Ambulatory Visit: Payer: Self-pay

## 2019-08-29 ENCOUNTER — Emergency Department
Admission: EM | Admit: 2019-08-29 | Discharge: 2019-08-29 | Disposition: A | Discharge status: Home / Self Care | Payer: PRIVATE HEALTH INSURANCE | Attending: Emergency Medicine | Admitting: Emergency Medicine

## 2019-08-29 DIAGNOSIS — Z7901 Long term (current) use of anticoagulants: Secondary | ICD-10-CM | POA: Insufficient documentation

## 2019-08-29 DIAGNOSIS — J209 Acute bronchitis, unspecified: Secondary | ICD-10-CM | POA: Insufficient documentation

## 2019-08-29 DIAGNOSIS — Z79899 Other long term (current) drug therapy: Secondary | ICD-10-CM | POA: Diagnosis not present

## 2019-08-29 DIAGNOSIS — I4891 Unspecified atrial fibrillation: Secondary | ICD-10-CM | POA: Insufficient documentation

## 2019-08-29 DIAGNOSIS — F17210 Nicotine dependence, cigarettes, uncomplicated: Secondary | ICD-10-CM | POA: Diagnosis not present

## 2019-08-29 DIAGNOSIS — R05 Cough: Secondary | ICD-10-CM | POA: Diagnosis present

## 2019-08-29 DIAGNOSIS — Z952 Presence of prosthetic heart valve: Secondary | ICD-10-CM | POA: Insufficient documentation

## 2019-08-29 LAB — CBC
HCT: 44.3 % (ref 39.0–52.0)
Hemoglobin: 14.9 g/dL (ref 13.0–17.0)
MCH: 26.4 pg (ref 26.0–34.0)
MCHC: 33.6 g/dL (ref 30.0–36.0)
MCV: 78.5 fL — ABNORMAL LOW (ref 80.0–100.0)
Platelets: 249 10*3/uL (ref 150–400)
RBC: 5.64 MIL/uL (ref 4.22–5.81)
RDW: 15 % (ref 11.5–15.5)
WBC: 12 10*3/uL — ABNORMAL HIGH (ref 4.0–10.5)
nRBC: 0 % (ref 0.0–0.2)

## 2019-08-29 LAB — COMPREHENSIVE METABOLIC PANEL
ALT: 15 U/L (ref 0–44)
AST: 20 U/L (ref 15–41)
Albumin: 3.4 g/dL — ABNORMAL LOW (ref 3.5–5.0)
Alkaline Phosphatase: 67 U/L (ref 38–126)
Anion gap: 9 (ref 5–15)
BUN: 17 mg/dL (ref 8–23)
CO2: 21 mmol/L — ABNORMAL LOW (ref 22–32)
Calcium: 8.8 mg/dL — ABNORMAL LOW (ref 8.9–10.3)
Chloride: 104 mmol/L (ref 98–111)
Creatinine, Ser: 1.08 mg/dL (ref 0.61–1.24)
GFR calc Af Amer: >60 mL/min (ref >60)
GFR calc non Af Amer: >60 mL/min (ref >60)
Glucose, Bld: 137 mg/dL — ABNORMAL HIGH (ref 70–99)
Potassium: 3.8 mmol/L (ref 3.5–5.1)
Sodium: 134 mmol/L — ABNORMAL LOW (ref 135–145)
Total Bilirubin: 0.9 mg/dL (ref 0.3–1.2)
Total Protein: 6.8 g/dL (ref 6.5–8.1)

## 2019-08-29 LAB — PROTIME-INR
INR: 2 — ABNORMAL HIGH (ref 0.8–1.2)
Prothrombin Time: 22.8 seconds — ABNORMAL HIGH (ref 11.4–15.2)

## 2019-08-29 LAB — TROPONIN I (HIGH SENSITIVITY): Troponin I (High Sensitivity): 7 ng/L (ref <18)

## 2019-08-29 MED ORDER — AZITHROMYCIN 250 MG PO TABS: ORAL_TABLET | ORAL | 0 refills | Status: DC

## 2019-08-29 MED ORDER — PREDNISONE 20 MG PO TABS
40.0000 mg | ORAL_TABLET | Freq: Once | ORAL | Status: AC
  Administered 2019-08-29: 40 mg via ORAL
  Filled 2019-08-29: qty 2

## 2019-08-29 MED ORDER — PREDNISONE 20 MG PO TABS: 40.0000 mg | ORAL_TABLET | Freq: Every day | ORAL | 0 refills | Status: DC

## 2019-08-29 MED ORDER — AZITHROMYCIN 500 MG PO TABS
500.0000 mg | ORAL_TABLET | Freq: Once | ORAL | Status: AC
  Administered 2019-08-29: 500 mg via ORAL
  Filled 2019-08-29: qty 1

## 2019-08-29 MED ORDER — HYDROCODONE-HOMATROPINE 5-1.5 MG/5ML PO SYRP: 5.0000 mL | ORAL_SOLUTION | Freq: Four times a day (QID) | ORAL | 0 refills | Status: DC | PRN

## 2019-08-29 MED ORDER — IPRATROPIUM-ALBUTEROL 0.5-2.5 (3) MG/3ML IN SOLN
3.0000 mL | Freq: Once | RESPIRATORY_TRACT | Status: AC
  Administered 2019-08-29: 15:00:00 3 mL via RESPIRATORY_TRACT
  Filled 2019-08-29: qty 3

## 2019-08-29 NOTE — ED Triage Notes (Signed)
C/O cough x 1 week.  Has episodes of uncontrollable coughing.  States feels as if heart rhythm is out again.

## 2019-08-29 NOTE — ED Provider Notes (Signed)
[facility] Emergency Department Provider Note ____________________________________________   First MD Initiated Contact with Patient 08/29/19 1425     (approximate)  I have reviewed the triage vital signs and the nursing notes.  HISTORY  Chief Complaint Cough and Palpitations  HPI Tyler Valencia is a 64 y.o. male history of atrial fibrillation, mechanical aortic valve  Patient reports about 2 weeks he had a dry cough.  He was seen for a few days ago, had a negative Covid test at [facility] urgent care. (Verified via Epic)  He is continued to have this persistent dry cough.  At times he will feel like his heart will race briefly and then go back to normal.  At times he had some slight sharp discomfort with coughing over the right chest  No nausea vomiting.  No fevers.  Cough is nonproductive dry been persistent for about 2 weeks.  He did get a little worse over the weekend, he reports he had a second Covid shot and then for about 2 days experienced body aches chills fatigue which is improving  Wife returned from beach vacation today, and felt that he should probably come over and get checked out as the symptoms have persisted now for about 2 weeks  No persistent chest pain.  Occasionally having some sharp pains in his right chest with coughing  He is currently on Coumadin, anticoagulated   Past Medical History:  Diagnosis Date  . AAA (abdominal aortic aneurysm) ([place])   . Atrial fibrillation ([place])   . High cholesterol   . Status post mechanical aortic valve replacement     Patient Active Problem List   Diagnosis Date Noted  . Hyperlipidemia 04/27/2018  . Tobacco abuse 04/27/2018  . Atrial fibrillation with RVR ([place]) 04/06/2018    Past Surgical History:  Procedure Laterality Date  . CARDIAC SURGERY  1996   aortic valve    Prior to Admission medications   Medication Sig Start Date End Date Taking? Authorizing Provider  amiodarone (PACERONE) 200  MG tablet Take by mouth. 03/11/19 03/10/20  [provider]  aspirin EC 81 MG tablet Take 81 mg by mouth daily.    [provider]  azithromycin (ZITHROMAX) 250 MG tablet Take 1 tablet daily by mouth 08/29/19   Delman Kitten, MD  ezetimibe (ZETIA) 10 MG tablet Take 10 mg by mouth daily.    [provider]  HYDROcodone-homatropine (HYCODAN) 5-1.5 MG/5ML syrup Take 5 mLs by mouth every 6 (six) hours as needed for cough. 08/29/19   Delman Kitten, MD  metoprolol tartrate (LOPRESSOR) 25 MG tablet Take 25 mg by mouth 2 (two) times daily.    [provider]  oxybutynin (DITROPAN) 5 MG tablet Take by mouth.    [provider]  predniSONE (DELTASONE) 20 MG tablet Take 2 tablets (40 mg total) by mouth daily with breakfast. 08/29/19   Delman Kitten, MD  sertraline (ZOLOFT) 25 MG tablet Take 25 mg by mouth daily.    [provider]  traZODone (DESYREL) 50 MG tablet Take 50 mg by mouth at bedtime.    [provider]  warfarin (COUMADIN) 4 MG tablet Take 4 mg by mouth daily. Take in addition to 5mg  for a total of 9mg  daily    [provider]  warfarin (COUMADIN) 5 MG tablet Take 5 mg by mouth daily. Take in addition to 4mg  for a total of 9mg  daily    [provider]    Allergies Amoxicillin, Lovastatin, Rosuvastatin,  and Penicillins  Family History  Problem Relation Age of Onset  . CAD Mother     Social History Social History   Tobacco Use  . Smoking status: Current Every Day Smoker    Packs/day: 0.50  . Smokeless tobacco: Never Used  Substance Use Topics  . Alcohol use: Not on file  . Drug use: Not on file    Review of Systems Constitutional: No fevers and chills except for a couple days after his Covid vaccination he felt chills and body aches Eyes: No visual changes. ENT: No sore throat. Cardiovascular: Denies chest pain except occasionally get a slight sharp pain feels like his ribs on the right chest. Respiratory:  Denies shortness of breath.  Denies wheezing. Gastrointestinal: No abdominal pain.   Musculoskeletal: Negative for back pain. Skin: Negative for rash. Neurological: Negative for headaches, areas of focal weakness or numbness.    ____________________________________________   PHYSICAL EXAM:  VITAL SIGNS: ED Triage Vitals  Enc Vitals Group     BP 08/29/19 1345 104/64     Pulse Rate 08/29/19 1345 (!) 48     Resp 08/29/19 1345 18     Temp 08/29/19 1345 98.7 F (37.1 C)     Temp Source 08/29/19 1345 Oral     SpO2 08/29/19 1345 95 %     Weight 08/29/19 1343 189 lb (85.7 kg)     Height 08/29/19 1343 5' 11.9" (1.826 m)     Head Circumference --      Peak Flow --      Pain Score --      Pain Loc --      Pain Edu? --      Excl. in [place]? --     Constitutional: Alert and oriented. Well appearing and in no acute distress.  Does have a frequent dry cough.  Overall though appears well and without distress. Eyes: Conjunctivae are normal. Head: Atraumatic. Nose: No congestion/rhinnorhea. Mouth/Throat: Mucous membranes are moist. Neck: No stridor.  Cardiovascular: Normal rate, at times iregular rhythm (does appear sinus on tele monitor). Grossly normal heart sounds though very prominent S2.  Good peripheral circulation. Respiratory: Normal respiratory effort.  No retractions. Lungs CTAB.  Lung sounds are somewhat coarse with expiration, but no noted wheezing.  He speaks in full clear sentences with normal oxygen saturation on room air. Gastrointestinal: Soft and nontender. No distention. Musculoskeletal: No lower extremity tenderness nor edema. Neurologic:  Normal speech and language. No gross focal neurologic deficits are appreciated.  Skin:  Skin is warm, dry and intact. No rash noted. Psychiatric: Mood and affect are normal. Speech and behavior are normal.  ____________________________________________   LABS (all labs ordered are listed, but only abnormal results are  displayed)  Labs Reviewed  CBC - Abnormal; Notable for the following components:      Result Value   WBC 12.0 (*)    MCV 78.5 (*)    All other components within normal limits  COMPREHENSIVE METABOLIC PANEL - Abnormal; Notable for the following components:   Sodium 134 (*)    CO2 21 (*)    Glucose, Bld 137 (*)    Calcium 8.8 (*)    Albumin 3.4 (*)    All other components within normal limits  PROTIME-INR - Abnormal; Notable for the following components:   Prothrombin Time 22.8 (*)    INR 2.0 (*)    All other components within normal limits  TROPONIN I (HIGH SENSITIVITY)  TROPONIN I (HIGH SENSITIVITY)   ____________________________________________  EKG  Reviewed interval IV 1510 Heart rate 89 QRS 100 QTc 440 Normal sinus rhythm, occasional PACs.  No evidence of acute ischemia.  Discussed and reviewed this with Dr. Ubaldo Glassing who looked the ekg on [facility] ____________________________________________  RADIOLOGY  DG Chest 2 View  Result Date: 08/29/2019 CLINICAL DATA:  COUGH FOR 1 WEEK, ARRHYTHMIA, TOBACCO ABUSE EXAM: CHEST - 2 VIEW COMPARISON:  04/06/2018 FINDINGS: Frontal and lateral views of the chest demonstrate stable postsurgical changes from median sternotomy. The cardiac silhouette is unremarkable. Stable background scarring and apical predominant bullous emphysema. No airspace disease, effusion, or pneumothorax. Lungs remain hyperinflated. IMPRESSION: 1. Stable emphysema, no acute process. Electronically Signed   By: Randa Ngo M.D.   On: 08/29/2019 14:39    Chest x-ray negative for acute, some emphysematous change seen ____________________________________________   PROCEDURES  Procedure(s) performed: 3 lead  .1-3 Lead EKG Interpretation Performed by: Delman Kitten, MD Authorized by: Delman Kitten, MD     Interpretation: normal     ECG rate:  80   ECG rate assessment: normal     Rhythm: sinus rhythm     Ectopy: none     Conduction: normal      Critical Care  performed: No  ____________________________________________   INITIAL IMPRESSION / ASSESSMENT AND PLAN / ED COURSE  Pertinent labs & imaging results that were available during my care of the patient were reviewed by me and considered in my medical decision making (see chart for details).   Patient comes for evaluation for persistent cough for about 2 weeks.  At times reports the cough can be somewhat uncontrolled.  Also feels like occasionally having some irregular skipping feelings of his heartbeat.  Currently on albuterol and inhaled steroid over the weekend, without notable relief.  Overall reassuring examination.  On telemetry monitoring appears to be in normal sinus with occasional irregularity.  Has a history of known A. fib, he is anticoagulated.  Hemodynamics stable, work of breathing reassuring, slightly coarse lung sounds.  Will provide steroid and a nebulizer treatment here.  He is tested negative for Covid and received both vaccinations at this point, I suspect is unlikely Covid.  Given his symptoms ongoing for about 2 weeks time, with very reassuring clinical examination at this time, will treat with azithromycin, prednisone, Hycodan.  Discussed risks and safe use of Hycodan cough syrup with the patient is in agreement.  Not driving today and will drive while taking    Clinical Course as of Aug 28 1541  Mon Aug 29, 2019  1522 EKG, labs, clinical history reviewed with Dr. Ubaldo Glassing (cardiology)   [MQ]    Clinical Course User Index [MQ] Delman Kitten, MD   Return precautions and treatment recommendations and follow-up discussed with the patient who is agreeable with the plan.  With patient's permission also discussed care plan with his wife  ____________________________________________   FINAL CLINICAL IMPRESSION(S) / ED DIAGNOSES  Final diagnoses:  Acute bronchitis, unspecified organism        Note:  This document was prepared using Dragon voice recognition software  and may include unintentional dictation errors       Delman Kitten, MD 08/29/19 1544

## 2019-09-05 ENCOUNTER — Other Ambulatory Visit: Payer: Self-pay | Admitting: Specialist

## 2019-11-24 IMAGING — CT CT ANGIO CHEST
3 of 7 series · 16 of 46 positions shown · IV contrast (APPLIED)
Comparison: Chest radiograph April 06, 2018

CLINICAL DATA: Chest pain.  History of atrial fibrillation.

EXAM:
CT ANGIOGRAPHY CHEST WITH CONTRAST
TECHNIQUE: Initially, axial CT images were obtained through the chest without
intravenous contrast material. Multidetector CT imaging of the chest
was performed using the standard protocol during bolus
administration of intravenous contrast. Multiplanar CT image
reconstructions and MIPs were obtained to evaluate the vascular
anatomy.
CONTRAST:  75mL OMNIPAQUE IOHEXOL 350 MG/ML SOLN

[Series 3: axial pre · axial · non-contrast · 0.73mm/px · z∈[-347,-107]mm · 5 of 74 slices shown]
[im 13/74  lung]
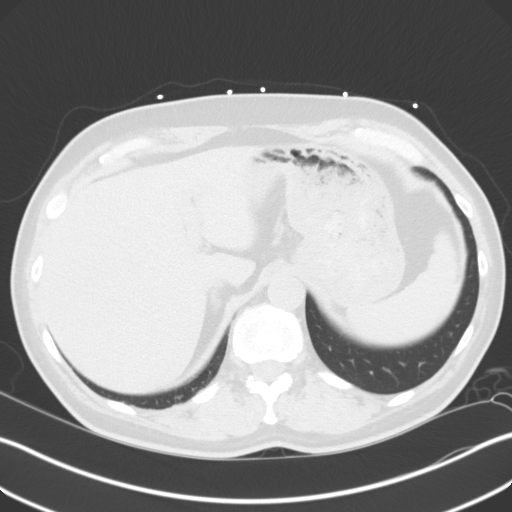
[im 25/74  lung]
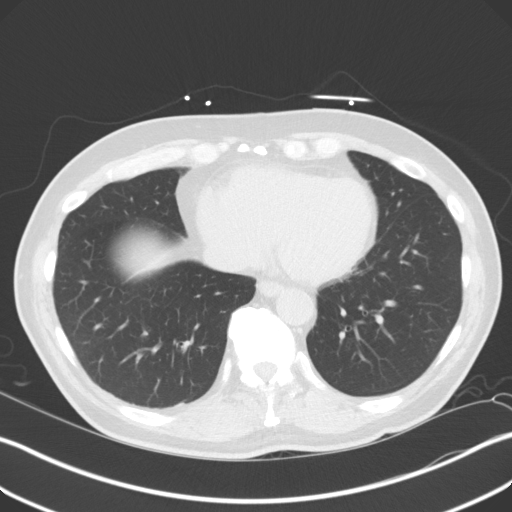
[im 37/74  lung]
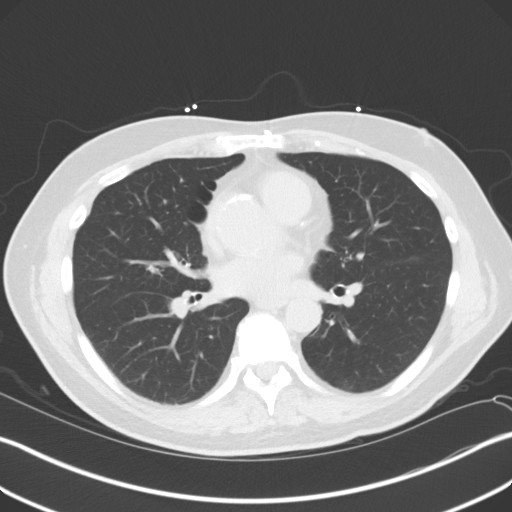
[im 49/74  lung]
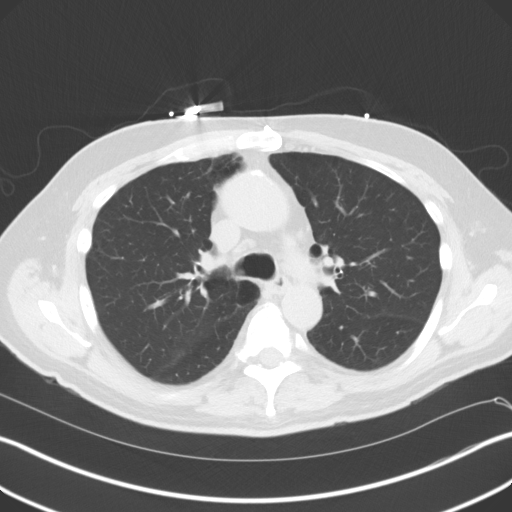
[im 61/74  lung]
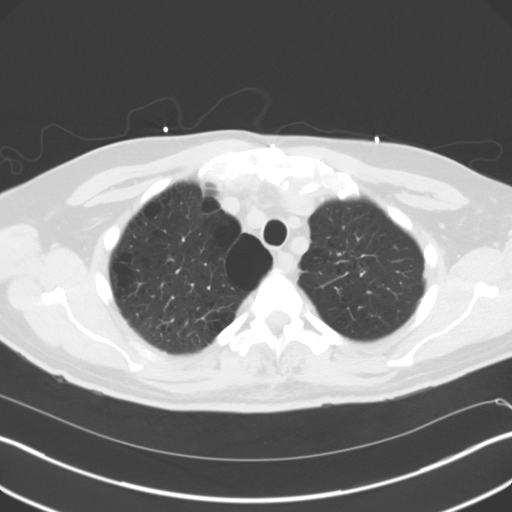

[Series 6: axial arterial · axial · arterial · 0.73mm/px · z∈[-354,-81]mm · 8 of 118 slices shown]
[im 14/118  lung]
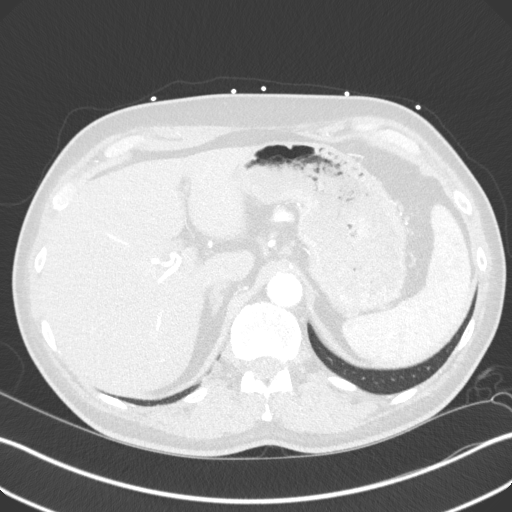
[im 27/118  soft-tissue]
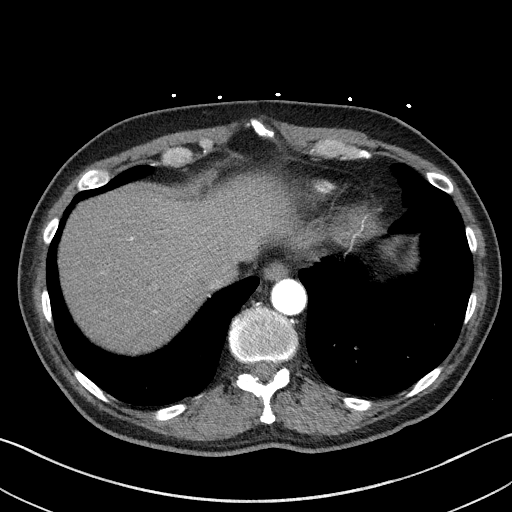
[im 40/118  lung]
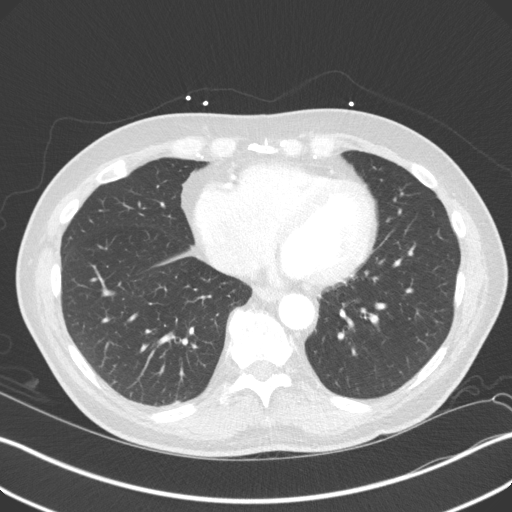
[im 53/118  soft-tissue]
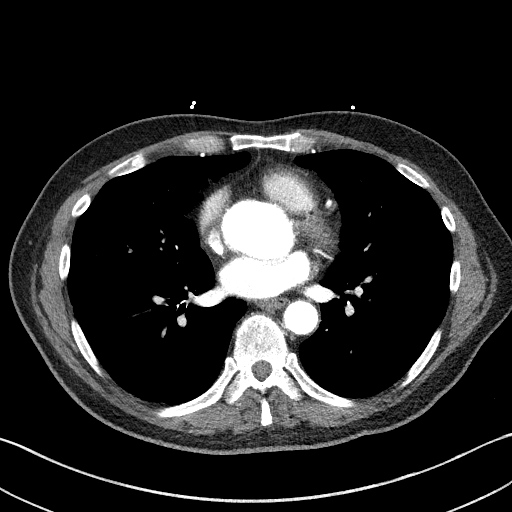
[im 66/118  lung]
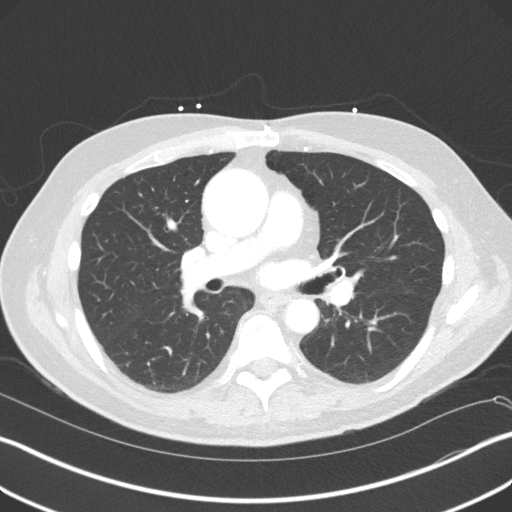
[im 79/118  soft-tissue]
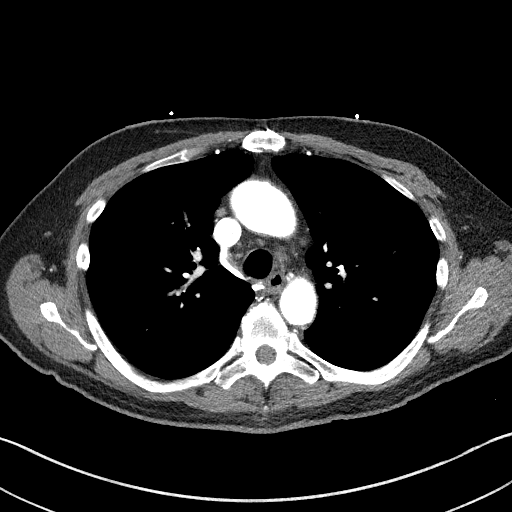
[im 92/118  lung]
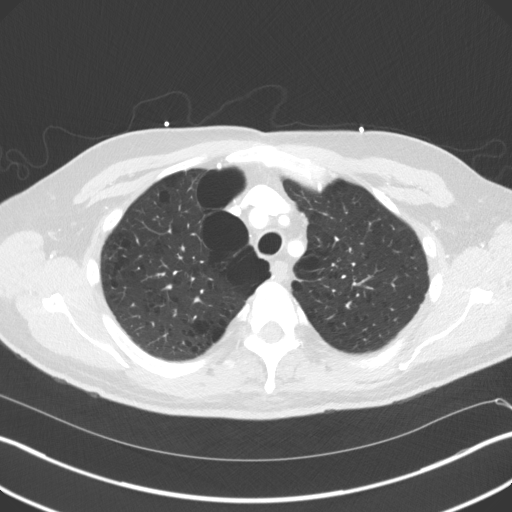
[im 105/118  soft-tissue]
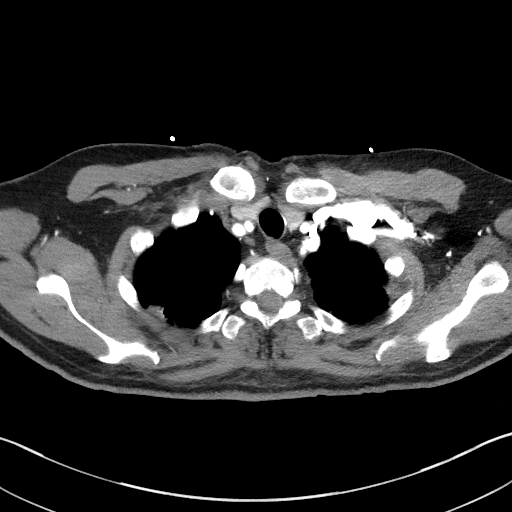

[Series 8: coronals · coronal · 0.71mm/px · 3 of 123 slices shown]
[im 31/123  soft-tissue]
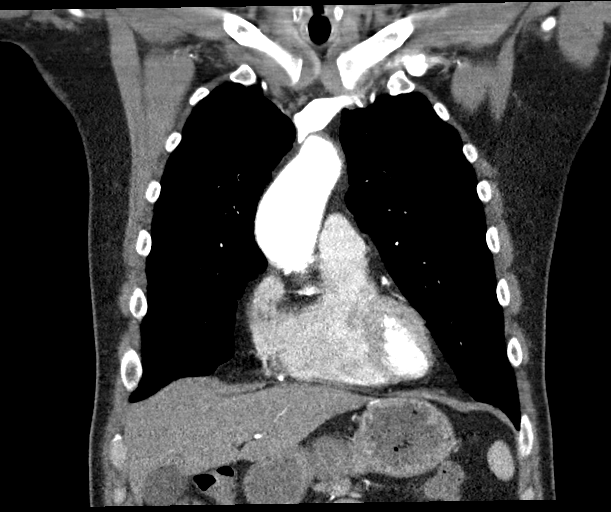
[im 62/123  soft-tissue]
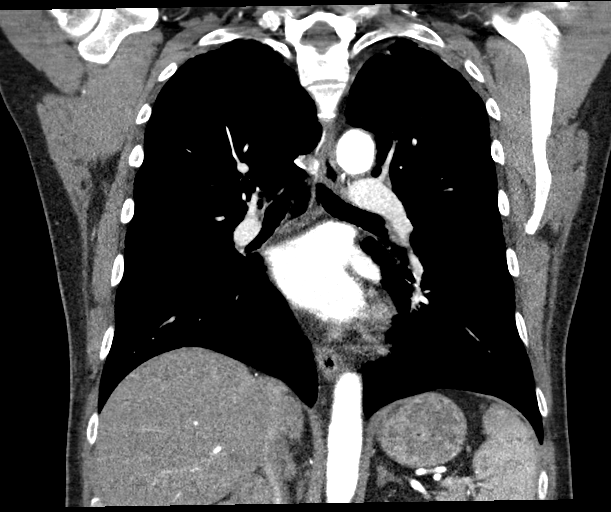
[im 92/123  soft-tissue]
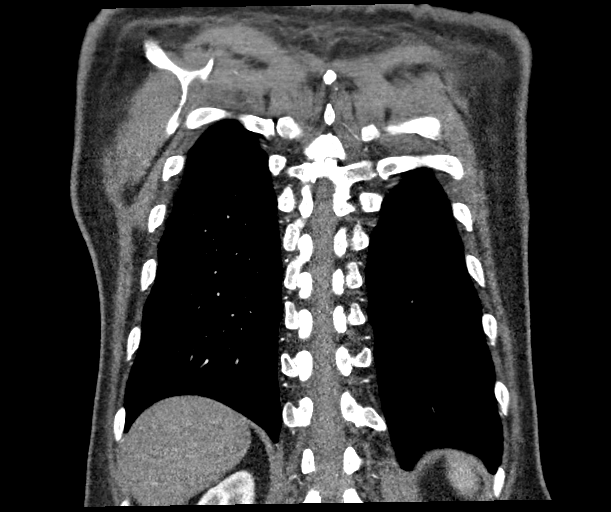

[16 of 46 positions shown; findings below may reference images not displayed]

FINDINGS: Cardiovascular: No intramural hematoma is evident on the precontrast
study. Patient is status post aortic valve replacement. There is
aneurysmal dilatation of the ascending thoracic aorta measuring
x 4.8 cm. No dissection evident. Visualized great vessels appear
normal except for minimal calcification at the origin of the left
subclavian artery. There is aortic atherosclerosis. There are
occasional foci of coronary artery calcification. No demonstrable
pulmonary embolus. No evident pericardial effusion or pericardial
thickening.

Mediastinum/Nodes: Thyroid appears unremarkable. There are scattered
subcentimeter mediastinal lymph nodes. There is a right hilar lymph
node measuring 1.3 x 1.2 cm. No other lymph nodes meeting size
criteria for adenopathy evident. No esophageal lesions are evident.

Lungs/Pleura: There is bullous disease in each upper lobe near the
apex, more on the right than on the left. There is apical regions
scarring bilaterally. Bullae are seen medially throughout the right
upper lobe region. There are areas of mild atelectatic change. There
is no edema or consolidation. There is a slight degree of proximal
lower lobe bronchiectatic change. No pleural effusion or pleural
thickening evident.

Upper Abdomen: There is hepatic steatosis. Visualized upper
abdominal structures otherwise appear unremarkable.

Musculoskeletal: The patient is status post median sternotomy. No
blastic or lytic bone lesions. No evident chest wall lesions.

Review of the MIP images confirms the above findings.
IMPRESSION: 1. Ascending thoracic aorta measures 4.9 x 4.8 cm. Ascending
thoracic aortic aneurysm. Recommend semi-annual imaging followup by
CTA or MRA and referral to cardiothoracic surgery if not already
obtained. This recommendation follows 3969
ACCF/AHA/AATS/ACR/ASA/SCA/ALCON/SHANIDA/RONLOR/GRABT Guidelines for the
Diagnosis and Management of Patients With Thoracic Aortic Disease.
Circulation. 3969; 121: [id number]. No thoracic aortic dissection.
There are foci of aortic atherosclerosis as well as foci of coronary
artery calcification. No demonstrable pulmonary embolus.

2. Bullous disease in the upper lobes, significantly more on the
right than on the left. Areas of mild atelectatic change. No edema
or consolidation. Scarring in both apices noted. Mild proximal lower
lobe bronchiectatic change bilaterally.

3.  Mildly prominent right hilar lymph node of uncertain etiology.

4.  Status post aortic valve replacement.

5.  Hepatic steatosis.

Comment: The prominence on the right on recent chest radiograph is
due to aneurysmal dilatation of the ascending thoracic aorta.

Aortic Atherosclerosis ([id number]-[id number]).

## 2019-11-24 IMAGING — DX DG CHEST 1V PORT
2 series · 2 of 2 positions shown · non-contrast
Comparison: August 04, 2016

CLINICAL DATA: Tachycardia and shortness of breath

EXAM:
PORTABLE CHEST 1 VIEW

[chest ap (1 of 2)]
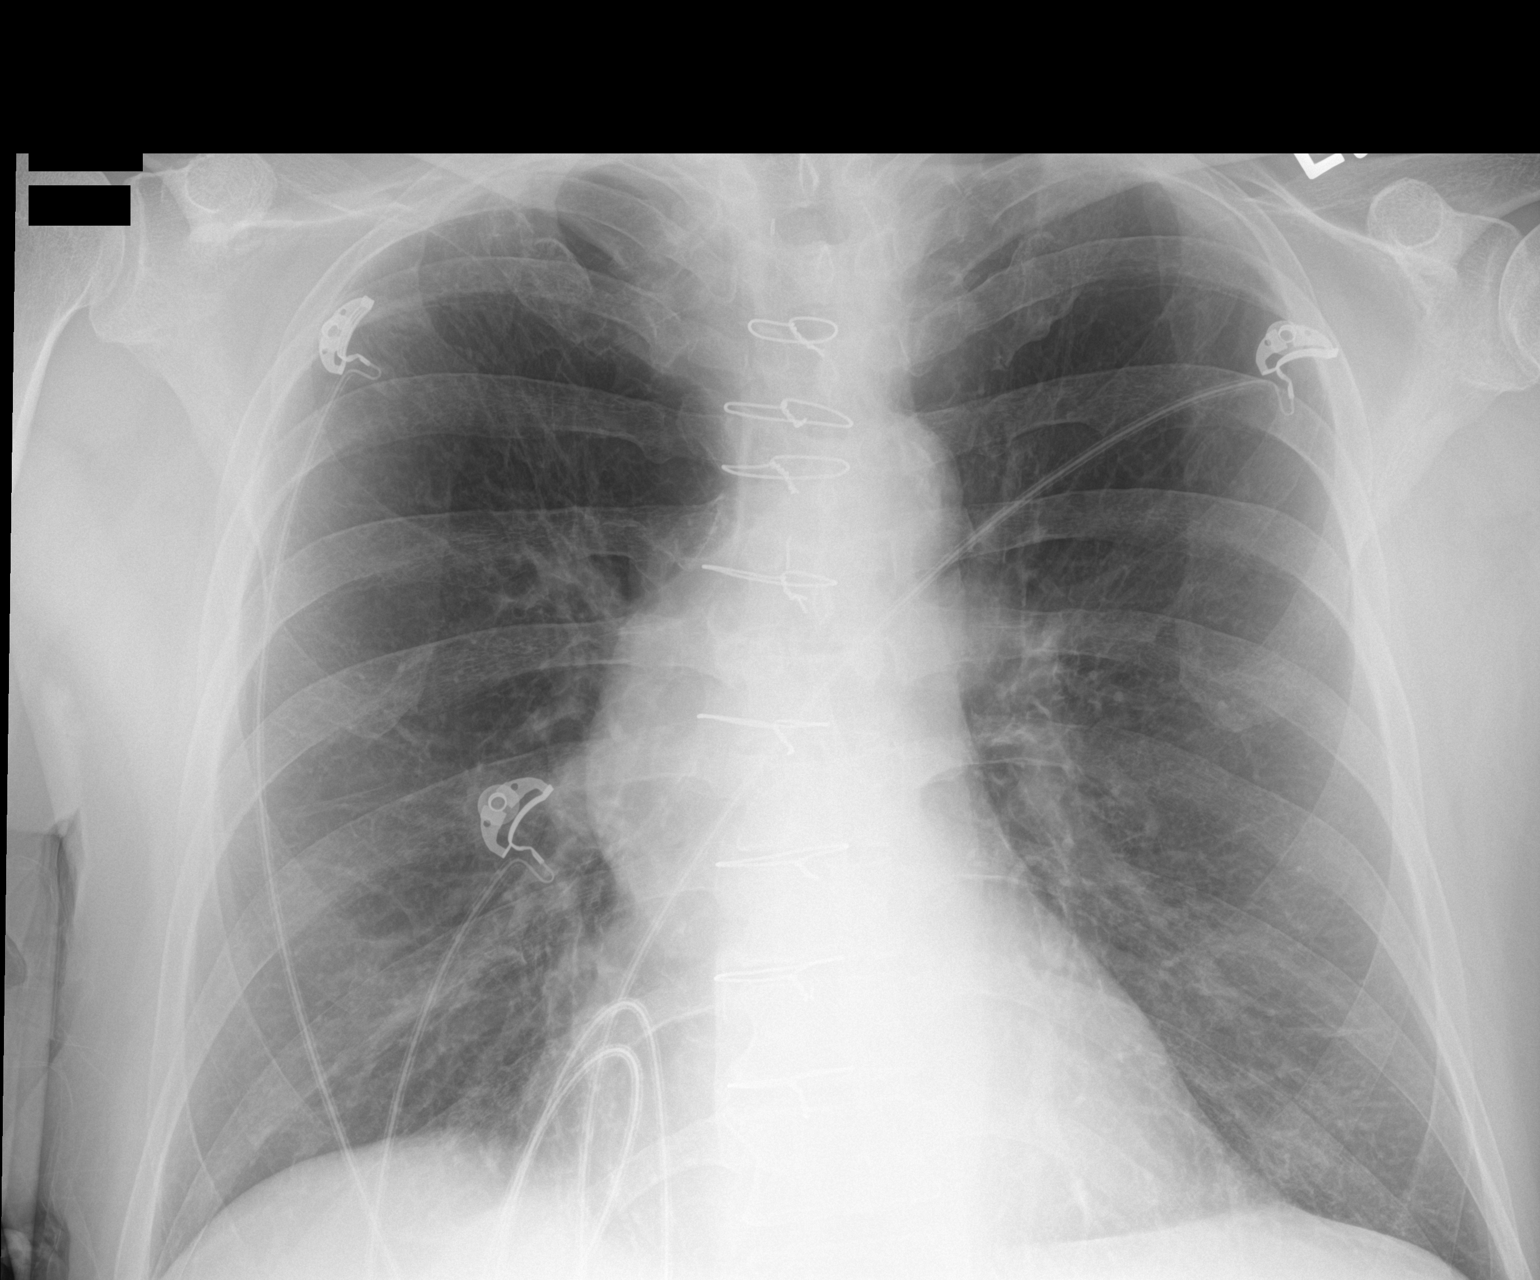

[chest ap (2 of 2)]
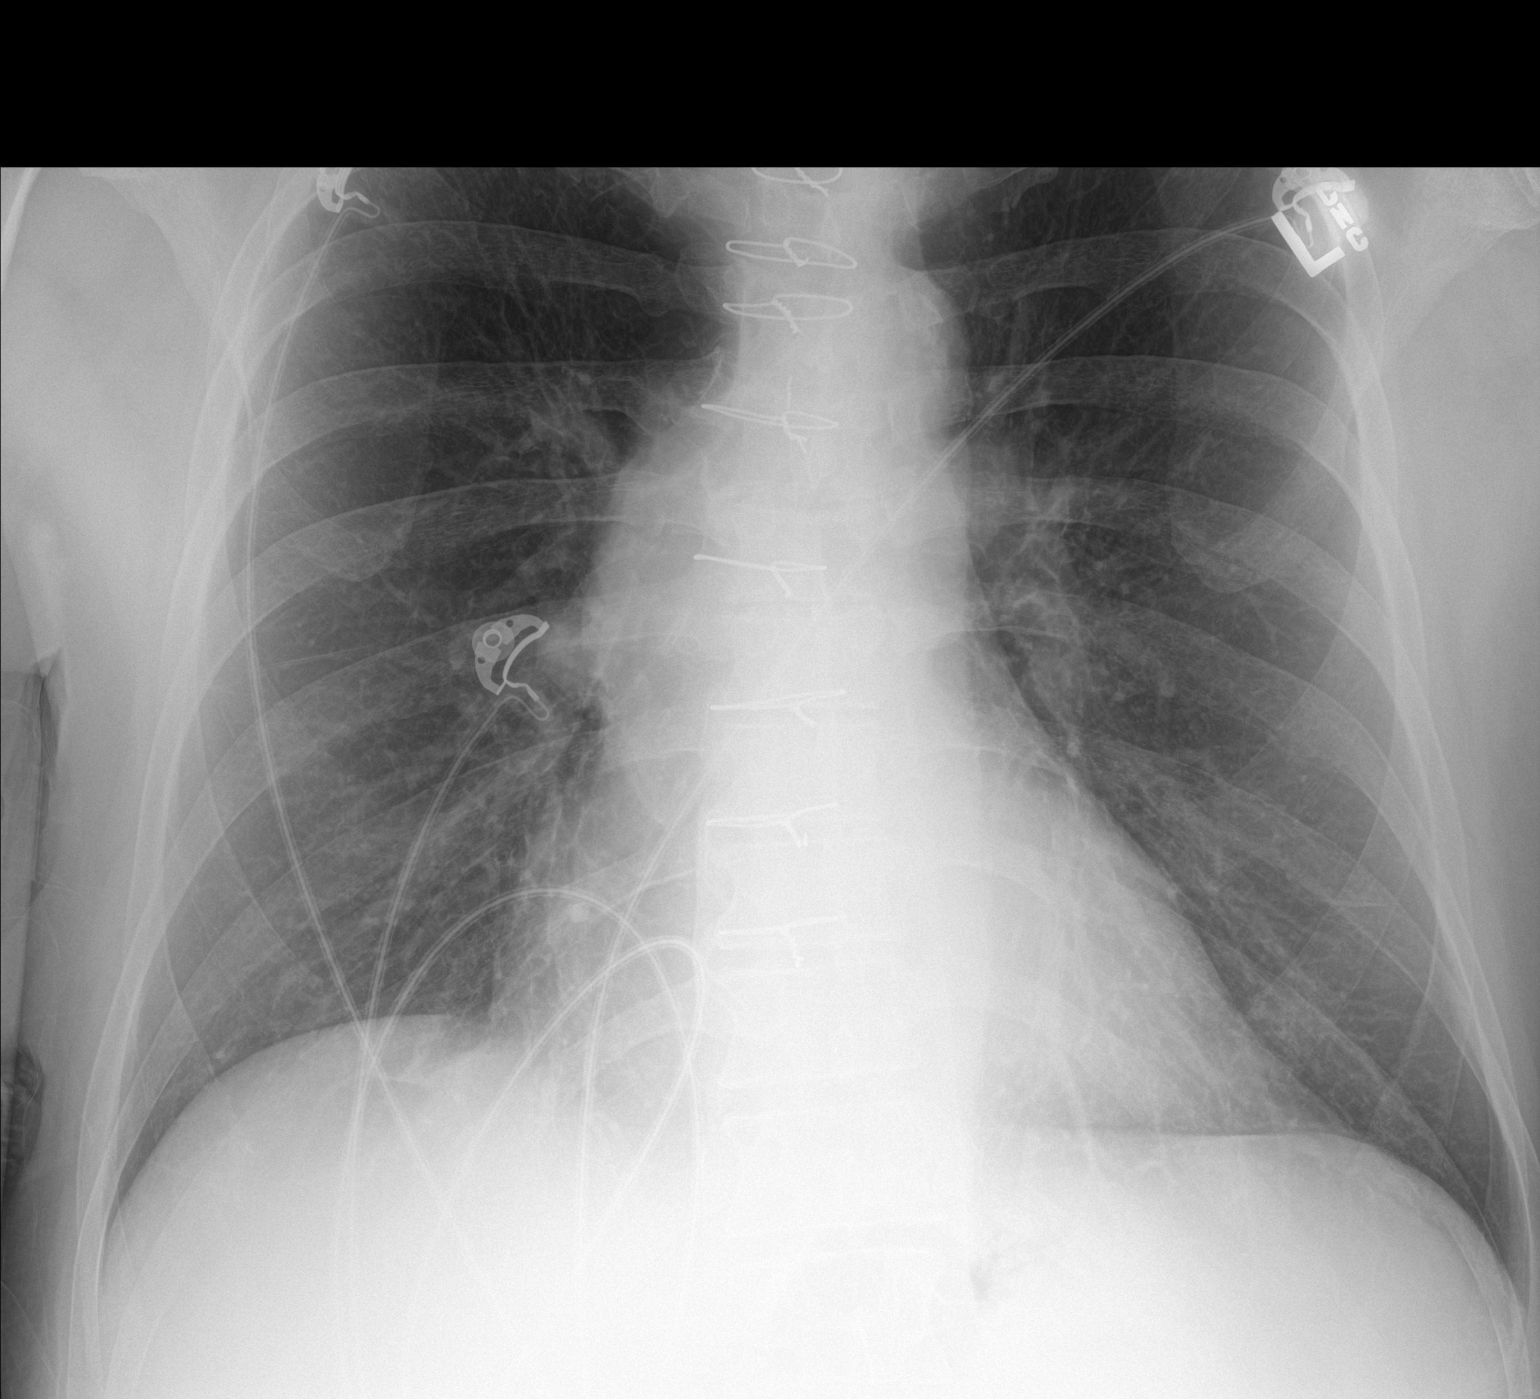

[2 of 2 positions shown; findings below may reference images not displayed]

FINDINGS: There is no edema or consolidation. The heart size is normal.
Pulmonary vascularity appears normal.

There is soft tissue prominence overlying the right hilum
potentially arising from the ascending aorta. There is aortic
atherosclerosis. Patient is status post median sternotomy. There is
no evident adenopathy. No bone lesions.
IMPRESSION: New prominence in the region of the ascending thoracic aorta.
Question dilatation of the ascending aorta versus potential
adenopathy on the right side. Given apparent change from the 3285
study, chest CT, ideally with intravenous contrast, is advised to
further evaluate.

No edema or consolidation. Stable previous median sternotomy. Aortic
atherosclerosis noted.

Aortic Atherosclerosis ([id number].J).

## 2020-01-11 ENCOUNTER — Other Ambulatory Visit: Payer: Self-pay | Admitting: Specialist

## 2020-01-30 ENCOUNTER — Other Ambulatory Visit: Payer: Self-pay | Admitting: Cardiology

## 2020-02-23 ENCOUNTER — Other Ambulatory Visit: Payer: Self-pay | Admitting: Dermatology

## 2020-03-30 ENCOUNTER — Other Ambulatory Visit: Payer: Self-pay | Admitting: Physician Assistant

## 2020-05-01 ENCOUNTER — Other Ambulatory Visit: Payer: Self-pay | Admitting: Internal Medicine

## 2020-05-01 ENCOUNTER — Ambulatory Visit: Payer: PRIVATE HEALTH INSURANCE | Attending: Internal Medicine

## 2020-05-01 DIAGNOSIS — Z23 Encounter for immunization: Secondary | ICD-10-CM

## 2020-05-01 NOTE — Progress Notes (Signed)
   Covid-19 Vaccination Clinic  Name:  Tyler Valencia    MRN: [id number] DOB: 1956-03-31  05/01/2020  Tyler Valencia was observed post Covid-19 immunization for 15 minutes without incident. He was provided with Vaccine Information Sheet and instruction to access the V-Safe system.   Tyler Valencia was instructed to call 911 with any severe reactions post vaccine: Marland Kitchen Difficulty breathing  . Swelling of face and throat  . A fast heartbeat  . A bad rash all over body  . Dizziness and weakness   Immunizations Administered    No immunizations on file.

## 2020-05-02 ENCOUNTER — Other Ambulatory Visit: Payer: Self-pay | Admitting: Internal Medicine

## 2020-05-16 ENCOUNTER — Other Ambulatory Visit: Payer: Self-pay | Admitting: Specialist

## 2020-06-15 ENCOUNTER — Other Ambulatory Visit: Payer: Self-pay | Admitting: Specialist

## 2020-07-23 ENCOUNTER — Other Ambulatory Visit: Payer: Self-pay | Admitting: Dentistry

## 2020-08-09 ENCOUNTER — Other Ambulatory Visit: Payer: Self-pay | Admitting: Internal Medicine

## 2020-08-16 ENCOUNTER — Other Ambulatory Visit: Payer: Self-pay | Admitting: Internal Medicine

## 2020-09-15 MED FILL — Metoprolol Tartrate Tab 25 MG: ORAL | 30 days supply | Qty: 60 | Fill #0 | Status: AC

## 2020-09-15 MED FILL — Warfarin Sodium Tab 4 MG: ORAL | 30 days supply | Qty: 60 | Fill #0 | Status: CN

## 2020-09-17 ENCOUNTER — Other Ambulatory Visit: Payer: Self-pay

## 2020-10-11 ENCOUNTER — Other Ambulatory Visit: Payer: Self-pay

## 2020-10-11 MED ORDER — ALBUTEROL SULFATE HFA 108 (90 BASE) MCG/ACT IN AERS
INHALATION_SPRAY | RESPIRATORY_TRACT | 6 refills | Status: DC
  Filled 2020-10-11: qty 8.5, 30d supply, fill #0
  Filled 2021-03-08: qty 8.5, 30d supply, fill #1
  Filled 2021-03-11: qty 8.5, 25d supply, fill #1

## 2020-10-12 ENCOUNTER — Other Ambulatory Visit: Payer: Self-pay

## 2020-10-17 ENCOUNTER — Ambulatory Visit
Admission: EM | Admit: 2020-10-17 | Discharge: 2020-10-17 | Disposition: A | Discharge status: Home / Self Care | Payer: PRIVATE HEALTH INSURANCE | Attending: Emergency Medicine | Admitting: Emergency Medicine

## 2020-10-17 ENCOUNTER — Other Ambulatory Visit: Payer: Self-pay

## 2020-10-17 DIAGNOSIS — J069 Acute upper respiratory infection, unspecified: Secondary | ICD-10-CM | POA: Insufficient documentation

## 2020-10-17 DIAGNOSIS — Z1152 Encounter for screening for COVID-19: Secondary | ICD-10-CM | POA: Insufficient documentation

## 2020-10-17 LAB — POCT RAPID STREP A (OFFICE): Rapid Strep A Screen: NEGATIVE

## 2020-10-17 NOTE — ED Triage Notes (Signed)
Pt reports having cough, congestion, sore throat and chills x2 days.

## 2020-10-17 NOTE — ED Provider Notes (Signed)
[name]    CSN: [id number] Arrival date & time: 10/17/20  1101      History   Chief Complaint Chief Complaint  Patient presents with  . Cough    HPI Tyler Valencia is a 65 y.o. male.   Patient presents with 2-day history of chills, nasal congestion, sore throat, cough.  He denies rash, shortness of breath, vomiting, diarrhea, or other symptoms.  No treatments attempted at home.  His medical history includes atrial fibrillation, AAA, aortic valve replacement, elevated cholesterol, former smoker.  The history is provided by the patient and medical records.    Past Medical History:  Diagnosis Date  . AAA (abdominal aortic aneurysm) ([place])   . Atrial fibrillation ([place])   . High cholesterol   . Status post mechanical aortic valve replacement     Patient Active Problem List   Diagnosis Date Noted  . Hyperlipidemia 04/27/2018  . Tobacco abuse 04/27/2018  . Atrial fibrillation with RVR ([place]) 04/06/2018    Past Surgical History:  Procedure Laterality Date  . CARDIAC SURGERY  1996   aortic valve       Home Medications    Prior to Admission medications   Medication Sig Start Date End Date Taking? Authorizing Provider  albuterol (VENTOLIN HFA) 108 (90 Base) MCG/ACT inhaler Inhale 2 inhalations into the lungs every 6 (six) hours as needed for Wheezing 10/11/20     amiodarone (PACERONE) 200 MG tablet Take by mouth. 03/11/19 03/10/20  [provider]  aspirin EC 81 MG tablet Take 81 mg by mouth daily.    [provider]  augmented betamethasone dipropionate (DIPROLENE-AF) 0.05 % ointment APPLY TO AFFECTED AREAS ON HANDS AND ARMS TWICE A DAY AS NEEDED 02/23/20 02/22/21  Dasher, Rayvon Char, MD  azithromycin (ZITHROMAX) 250 MG tablet Take 1 tablet daily by mouth 08/29/19   Delman Kitten, MD  clindamycin (CLEOCIN) 150 MG capsule TAKE 2 CAPSULES BY MOUTH THREE TIMES DAILY FOR 7 DAYS 03/30/20 03/30/21  Caryn Section, Linden Dolin, PA-C  clindamycin (CLEOCIN) 300 MG  capsule TAKE 2 CAPSULES BY MOUTH 1 HOUR PRIOR TO DENTAL APPOINTMENT 07/23/20 07/23/21  Gordy Levan I., DDS  COVID-19 mRNA vaccine, Moderna, 100 MCG/0.5ML injection USE AS DIRECTED 05/01/20 05/01/21  Carlyle Basques, MD  ezetimibe (ZETIA) 10 MG tablet Take 10 mg by mouth daily.    [provider]  ezetimibe (ZETIA) 10 MG tablet TAKE 1 TABLET (10 MG TOTAL) BY MOUTH NIGHTLY 08/16/20 08/16/21  Tama High III, MD  fluticasone furoate-vilanterol (BREO ELLIPTA) 100-25 MCG/INH AEPB INHALE 1 INHALATION INTO THE LUNGS ONCE DAILY 06/15/20 06/15/21  Erby Pian, MD  fluticasone furoate-vilanterol (BREO ELLIPTA) 100-25 MCG/INH AEPB INHALE 1 PUFF BY MOUTH INTO THE LUNGS ONCE DAILY 05/16/20 05/16/21  Erby Pian, MD  fluticasone furoate-vilanterol (BREO ELLIPTA) 100-25 MCG/INH AEPB INHALE 1 INHALATION INTO THE LUNGS ONCE DAILY 01/11/20 01/10/21  Erby Pian, MD  HYDROcodone-homatropine (HYCODAN) 5-1.5 MG/5ML syrup Take 5 mLs by mouth every 6 (six) hours as needed for cough. 08/29/19   Delman Kitten, MD  metoprolol tartrate (LOPRESSOR) 25 MG tablet Take 25 mg by mouth 2 (two) times daily.    [provider]  metoprolol tartrate (LOPRESSOR) 25 MG tablet TAKE 1 TABLET BY MOUTH TWICE DAILY 08/16/20 08/16/21  Teodoro Spray, MD  metoprolol tartrate (LOPRESSOR) 25 MG tablet TAKE 1 TABLET BY MOUTH TWICE DAILY 07/26/19 07/25/20  Avie Arenas, PA-C  oseltamivir (TAMIFLU) 75 MG capsule TAKE 1 CAPSULE BY MOUTH  TWO TIMES DAILY FOR 5 DAYS 05/02/20 05/02/21  Tama High III, MD  oxybutynin (DITROPAN) 5 MG tablet Take by mouth.    [provider]  pantoprazole (PROTONIX) 20 MG tablet TAKE 1 TABLET BY MOUTH ONCE DAILY 08/09/20 08/09/21  Tama High III, MD  pantoprazole (PROTONIX) 20 MG tablet TAKE 1 TABLET BY MOUTH ONCE DAILY 06/15/20 06/15/21  Erby Pian, MD  predniSONE (DELTASONE) 20 MG tablet Take 2 tablets (40 mg total) by mouth daily with breakfast. 08/29/19   Delman Kitten, MD   sertraline (ZOLOFT) 25 MG tablet Take 25 mg by mouth daily.    [provider]  tamsulosin (FLOMAX) 0.4 MG CAPS capsule TAKE 1 CAPSULE BY MOUTH NIGHTLY TAKE 30 MINUTES AFTER SAME MEAL EACH DAY. 08/09/20 08/09/21  Tama High III, MD  traZODone (DESYREL) 50 MG tablet Take 50 mg by mouth at bedtime.    [provider]  warfarin (COUMADIN) 4 MG tablet Take 4 mg by mouth daily. Take in addition to 5mg  for a total of 9mg  daily    [provider]  warfarin (COUMADIN) 4 MG tablet TAKE 2 TABLETS (8 MG) BY MOUTH ONCE DAILY 08/16/20 08/16/21  Teodoro Spray, MD  warfarin (COUMADIN) 4 MG tablet TAKE 2 TABLETS (8 MG TOTAL) BY MOUTH ONCE DAILY 01/30/20 01/29/21  Teodoro Spray, MD  warfarin (COUMADIN) 5 MG tablet Take 5 mg by mouth daily. Take in addition to 4mg  for a total of 9mg  daily    [provider]    Family History Family History  Problem Relation Age of Onset  . CAD Mother     Social History Social History   Tobacco Use  . Smoking status: Former Smoker    Packs/day: 0.50  . Smokeless tobacco: Never Used  Substance Use Topics  . Alcohol use: Not Currently  . Drug use: Not Currently     Allergies   Amoxicillin, Lovastatin, Rosuvastatin, and Penicillins   Review of Systems Review of Systems  Constitutional: Positive for chills. Negative for fever.  HENT: Positive for congestion and sore throat. Negative for ear pain.   Respiratory: Positive for cough. Negative for shortness of breath.   Cardiovascular: Negative for chest pain and palpitations.  Gastrointestinal: Negative for abdominal pain, diarrhea and vomiting.  Skin: Negative for color change and rash.  All other systems reviewed and are negative.    Physical Exam Triage Vital Signs ED Triage Vitals  Enc Vitals Group     BP      Pulse      Resp      Temp      Temp src      SpO2      Weight      Height      Head Circumference      Peak Flow      Pain Score      Pain Loc       Pain Edu?      Excl. in [place]?    No data found.  Updated Vital Signs BP 94/60   Pulse (!) 105   Temp 99.4 F (37.4 C) (Oral)   Resp 18   Ht 6' (1.829 m)   SpO2 97%   BMI 25.63 kg/m   Visual Acuity Right Eye Distance:   Left Eye Distance:   Bilateral Distance:    Right Eye Near:   Left Eye Near:    Bilateral Near:     Physical Exam Vitals  and nursing note reviewed.  Constitutional:      General: He is not in acute distress.    Appearance: He is well-developed. He is ill-appearing.  HENT:     Head: Normocephalic and atraumatic.     Right Ear: Tympanic membrane normal.     Left Ear: Tympanic membrane normal.     Nose: Nose normal.     Mouth/Throat:     Mouth: Mucous membranes are moist.     Pharynx: Oropharynx is clear.     Comments: Clear postnasal drip. Eyes:     Conjunctiva/sclera: Conjunctivae normal.  Cardiovascular:     Rate and Rhythm: Normal rate. Rhythm irregular.     Heart sounds: Normal heart sounds.  Pulmonary:     Effort: Pulmonary effort is normal. No respiratory distress.     Breath sounds: Normal breath sounds.  Abdominal:     Palpations: Abdomen is soft.     Tenderness: There is no abdominal tenderness.  Musculoskeletal:     Cervical back: Neck supple.  Skin:    General: Skin is warm and dry.  Neurological:     General: No focal deficit present.     Mental Status: He is alert and oriented to person, place, and time.  Psychiatric:        Mood and Affect: Mood normal.        Behavior: Behavior normal.      UC Treatments / Results  Labs (all labs ordered are listed, but only abnormal results are displayed) Labs Reviewed  NOVEL CORONAVIRUS, NAA  CULTURE, GROUP A STREP [facility])  POCT RAPID STREP A (OFFICE)    EKG   Radiology No results found.  Procedures Procedures (including critical care time)  Medications Ordered in UC Medications - No data to display  Initial Impression / Assessment and Plan / UC Course  I have reviewed  the triage vital signs and the nursing notes.  Pertinent labs & imaging results that were available during my care of the patient were reviewed by me and considered in my medical decision making (see chart for details).   Viral URI with cough.  Rapid strep negative; culture pending.  Influenza and COVID pending.  Instructed patient to self quarantine until the test results are back.  Discussed symptomatic treatment including Tylenol, rest, hydration.  Instructed patient to follow up with PCP if his symptoms are not improving.  Patient agrees to plan of care.    Final Clinical Impressions(s) / UC Diagnoses   Final diagnoses:  Encounter for screening for COVID-19  Viral URI with cough     Discharge Instructions     Your rapid strep test is negative.  A throat culture is pending; we will call you if it is positive requiring treatment.    Your COVID and Influenza tests are pending.  You should self quarantine until the test results are back.    Take Tylenol as needed for fever or discomfort.  Rest and keep yourself hydrated.    Follow-up with your primary care provider if your symptoms are not improving.        ED Prescriptions    None     PDMP not reviewed this encounter.   Sharion Balloon, NP 10/17/20 [phone]

## 2020-10-17 NOTE — Discharge Instructions (Addendum)
Your rapid strep test is negative.  A throat culture is pending; we will call you if it is positive requiring treatment.    Your COVID and Influenza tests are pending.  You should self quarantine until the test results are back.    Take Tylenol as needed for fever or discomfort.  Rest and keep yourself hydrated.    Follow-up with your primary care provider if your symptoms are not improving.

## 2020-10-18 LAB — NOVEL CORONAVIRUS, NAA: SARS-CoV-2, NAA: NOT DETECTED

## 2020-10-18 LAB — SARS-COV-2, NAA 2 DAY TAT

## 2020-10-19 LAB — CULTURE, GROUP A STREP (THRC)

## 2020-10-22 ENCOUNTER — Other Ambulatory Visit: Payer: Self-pay

## 2020-10-22 MED FILL — Metoprolol Tartrate Tab 25 MG: ORAL | 30 days supply | Qty: 60 | Fill #1 | Status: AC

## 2020-10-23 ENCOUNTER — Other Ambulatory Visit: Payer: Self-pay

## 2020-10-23 MED ORDER — PREDNISONE 10 MG PO TABS
ORAL_TABLET | ORAL | 0 refills | Status: DC
  Filled 2020-10-23: qty 20, 8d supply, fill #0

## 2020-10-23 MED ORDER — DOXYCYCLINE HYCLATE 100 MG PO CAPS
100.0000 mg | ORAL_CAPSULE | Freq: Two times a day (BID) | ORAL | 0 refills | Status: DC
  Filled 2020-10-23: qty 14, 7d supply, fill #0

## 2020-10-23 MED ORDER — HYDROCOD POLST-CPM POLST ER 10-8 MG/5ML PO SUER
ORAL | 0 refills | Status: DC
  Filled 2020-10-23: qty 50, 5d supply, fill #0

## 2020-10-29 ENCOUNTER — Other Ambulatory Visit: Payer: Self-pay

## 2020-10-29 MED ORDER — PREDNISONE 10 MG PO TABS
ORAL_TABLET | ORAL | 0 refills | Status: DC
  Filled 2020-10-29: qty 20, 8d supply, fill #0

## 2020-11-01 ENCOUNTER — Other Ambulatory Visit: Payer: Self-pay

## 2020-11-01 MED ORDER — HYDROCOD POLST-CPM POLST ER 10-8 MG/5ML PO SUER
ORAL | 0 refills | Status: DC
  Filled 2020-11-01: qty 50, 5d supply, fill #0

## 2020-11-02 ENCOUNTER — Other Ambulatory Visit: Payer: Self-pay

## 2020-11-02 MED ORDER — HYDROCODONE BIT-HOMATROP MBR 5-1.5 MG/5ML PO SOLN
ORAL | 0 refills | Status: DC
  Filled 2020-11-02: qty 115, 6d supply, fill #0

## 2020-11-02 MED ORDER — PREDNISONE 10 MG PO TABS
10.0000 mg | ORAL_TABLET | Freq: Every day | ORAL | 0 refills | Status: DC
  Filled 2020-11-02 (×2): qty 15, 15d supply, fill #0

## 2020-11-05 ENCOUNTER — Other Ambulatory Visit: Payer: Self-pay

## 2020-11-05 MED ORDER — WARFARIN SODIUM 4 MG PO TABS
ORAL_TABLET | ORAL | 5 refills | Status: DC
  Filled 2020-11-05: qty 60, 30d supply, fill #0
  Filled 2021-01-24: qty 60, 30d supply, fill #1
  Filled 2021-02-18: qty 60, 30d supply, fill #2
  Filled 2021-05-15: qty 60, 30d supply, fill #3
  Filled 2021-06-17 (×2): qty 60, 30d supply, fill #4

## 2020-11-20 ENCOUNTER — Other Ambulatory Visit: Payer: Self-pay

## 2020-11-20 MED ORDER — METOPROLOL TARTRATE 25 MG PO TABS
25.0000 mg | ORAL_TABLET | Freq: Two times a day (BID) | ORAL | 11 refills | Status: AC
  Filled 2020-11-20: qty 60, 30d supply, fill #0

## 2020-12-04 ENCOUNTER — Other Ambulatory Visit: Payer: Self-pay

## 2020-12-04 MED ORDER — EZETIMIBE 10 MG PO TABS
ORAL_TABLET | ORAL | 1 refills | Status: DC
  Filled 2020-12-04: qty 90, 90d supply, fill #0
  Filled 2021-03-11: qty 90, 90d supply, fill #1

## 2020-12-07 ENCOUNTER — Other Ambulatory Visit: Payer: Self-pay

## 2020-12-07 MED ORDER — PREDNISONE 20 MG PO TABS
ORAL_TABLET | ORAL | 0 refills | Status: DC
  Filled 2020-12-07: qty 10, 5d supply, fill #0

## 2020-12-07 MED ORDER — PAXLOVID 20 X 150 MG & 10 X 100MG PO TBPK
ORAL_TABLET | ORAL | 0 refills | Status: DC
  Filled 2020-12-07: qty 30, 5d supply, fill #0

## 2020-12-20 ENCOUNTER — Other Ambulatory Visit: Payer: Self-pay

## 2020-12-20 MED ORDER — METOPROLOL TARTRATE 25 MG PO TABS
25.0000 mg | ORAL_TABLET | Freq: Two times a day (BID) | ORAL | 11 refills | Status: DC
  Filled 2020-12-20: qty 60, 30d supply, fill #0

## 2021-01-22 ENCOUNTER — Other Ambulatory Visit: Payer: Self-pay

## 2021-01-22 MED ORDER — METOPROLOL TARTRATE 25 MG PO TABS
25.0000 mg | ORAL_TABLET | Freq: Two times a day (BID) | ORAL | 11 refills | Status: DC
  Filled 2021-01-22: qty 60, 30d supply, fill #0
  Filled 2021-02-18: qty 60, 30d supply, fill #1

## 2021-01-23 ENCOUNTER — Other Ambulatory Visit: Payer: Self-pay

## 2021-01-23 MED ORDER — SERTRALINE HCL 25 MG PO TABS
25.0000 mg | ORAL_TABLET | Freq: Every day | ORAL | 11 refills | Status: AC
  Filled 2021-01-23: qty 30, 30d supply, fill #0

## 2021-01-25 ENCOUNTER — Other Ambulatory Visit: Payer: Self-pay

## 2021-01-25 MED ORDER — EZETIMIBE 10 MG PO TABS
10.0000 mg | ORAL_TABLET | Freq: Every day | ORAL | 1 refills | Status: DC
  Filled 2021-01-25 – 2021-03-08 (×2): qty 90, 90d supply, fill #0

## 2021-01-25 MED ORDER — TAMSULOSIN HCL 0.4 MG PO CAPS
ORAL_CAPSULE | ORAL | 11 refills | Status: DC
  Filled 2021-01-25: qty 30, 30d supply, fill #0
  Filled 2021-02-18: qty 30, 30d supply, fill #1

## 2021-01-25 MED ORDER — PANTOPRAZOLE SODIUM 20 MG PO TBEC
DELAYED_RELEASE_TABLET | ORAL | 3 refills | Status: AC
  Filled 2021-01-25 – 2021-02-18 (×2): qty 90, 90d supply, fill #0
  Filled 2021-06-17: qty 30, 30d supply, fill #0

## 2021-02-18 ENCOUNTER — Other Ambulatory Visit: Payer: Self-pay

## 2021-02-18 MED FILL — Fluticasone Furoate-Vilanterol Aero Powd BA 100-25 MCG/ACT: RESPIRATORY_TRACT | 30 days supply | Qty: 60 | Fill #0 | Status: CN

## 2021-02-21 ENCOUNTER — Other Ambulatory Visit: Payer: Self-pay

## 2021-03-11 ENCOUNTER — Other Ambulatory Visit: Payer: Self-pay

## 2021-03-25 ENCOUNTER — Other Ambulatory Visit: Payer: Self-pay

## 2021-03-25 MED ORDER — METOPROLOL TARTRATE 25 MG PO TABS
25.0000 mg | ORAL_TABLET | Freq: Two times a day (BID) | ORAL | 11 refills | Status: DC
  Filled 2021-03-25: qty 60, 30d supply, fill #0

## 2021-03-25 MED ORDER — ALBUTEROL SULFATE HFA 108 (90 BASE) MCG/ACT IN AERS
INHALATION_SPRAY | RESPIRATORY_TRACT | 6 refills | Status: DC
  Filled 2021-03-25: qty 8.5, 30d supply, fill #0

## 2021-04-19 ENCOUNTER — Other Ambulatory Visit: Payer: Self-pay

## 2021-04-19 MED ORDER — SERTRALINE HCL 25 MG PO TABS
25.0000 mg | ORAL_TABLET | Freq: Every day | ORAL | 11 refills | Status: DC
  Filled 2021-04-19: qty 30, 30d supply, fill #0

## 2021-04-19 MED ORDER — TAMSULOSIN HCL 0.4 MG PO CAPS
ORAL_CAPSULE | ORAL | 11 refills | Status: AC
  Filled 2021-04-19: qty 30, 30d supply, fill #0

## 2021-05-14 ENCOUNTER — Other Ambulatory Visit: Payer: Self-pay

## 2021-05-14 MED ORDER — METOPROLOL TARTRATE 25 MG PO TABS
25.0000 mg | ORAL_TABLET | Freq: Two times a day (BID) | ORAL | 11 refills | Status: DC
  Filled 2021-05-14: qty 60, 30d supply, fill #0
  Filled 2021-06-17: qty 60, 30d supply, fill #1

## 2021-05-15 ENCOUNTER — Other Ambulatory Visit: Payer: Self-pay

## 2021-06-17 ENCOUNTER — Other Ambulatory Visit: Payer: Self-pay

## 2021-06-17 MED ORDER — PANTOPRAZOLE SODIUM 20 MG PO TBEC
DELAYED_RELEASE_TABLET | ORAL | 3 refills | Status: DC
  Filled 2021-06-17: qty 90, 90d supply, fill #0

## 2021-06-17 MED ORDER — TRAZODONE HCL 50 MG PO TABS
50.0000 mg | ORAL_TABLET | Freq: Every day | ORAL | 3 refills | Status: AC
  Filled 2021-06-17: qty 30, 30d supply, fill #0

## 2021-06-19 ENCOUNTER — Other Ambulatory Visit: Payer: Self-pay

## 2021-06-19 MED ORDER — WARFARIN SODIUM 1 MG PO TABS
ORAL_TABLET | ORAL | 3 refills | Status: DC
  Filled 2021-06-19: qty 30, 30d supply, fill #0

## 2021-07-01 ENCOUNTER — Other Ambulatory Visit: Payer: Self-pay

## 2021-07-01 MED ORDER — ZOSTER VAC RECOMB ADJUVANTED 50 MCG/0.5ML IM SUSR
INTRAMUSCULAR | 1 refills | Status: AC
  Filled 2021-07-01: qty 0.5, 1d supply, fill #0
  Filled 2021-07-02: qty 0.5, 30d supply, fill #0
  Filled 2021-09-24: qty 0.5, 30d supply, fill #1

## 2021-07-02 ENCOUNTER — Other Ambulatory Visit: Payer: Self-pay

## 2021-07-02 MED ORDER — EZETIMIBE 10 MG PO TABS
10.0000 mg | ORAL_TABLET | Freq: Every day | ORAL | 1 refills | Status: DC
  Filled 2021-07-02: qty 30, 30d supply, fill #0

## 2021-07-18 ENCOUNTER — Other Ambulatory Visit: Payer: Self-pay

## 2021-07-18 MED ORDER — METOPROLOL TARTRATE 25 MG PO TABS
25.0000 mg | ORAL_TABLET | Freq: Two times a day (BID) | ORAL | 11 refills | Status: DC
  Filled 2021-07-18 (×2): qty 60, 30d supply, fill #0

## 2021-07-18 MED ORDER — EZETIMIBE 10 MG PO TABS
10.0000 mg | ORAL_TABLET | Freq: Every day | ORAL | 1 refills | Status: DC
  Filled 2021-07-18: qty 30, 30d supply, fill #0

## 2021-07-18 MED ORDER — TAMSULOSIN HCL 0.4 MG PO CAPS
ORAL_CAPSULE | ORAL | 1 refills | Status: DC
  Filled 2021-07-18: qty 30, 30d supply, fill #0

## 2021-08-05 ENCOUNTER — Other Ambulatory Visit: Payer: Self-pay

## 2021-08-05 MED ORDER — ALBUTEROL SULFATE HFA 108 (90 BASE) MCG/ACT IN AERS
INHALATION_SPRAY | RESPIRATORY_TRACT | 0 refills | Status: DC
  Filled 2021-08-05: qty 8.5, 25d supply, fill #0

## 2021-08-06 ENCOUNTER — Other Ambulatory Visit: Payer: Self-pay

## 2021-08-06 MED ORDER — PANTOPRAZOLE SODIUM 20 MG PO TBEC
DELAYED_RELEASE_TABLET | ORAL | 3 refills | Status: DC
  Filled 2021-08-06: qty 30, 30d supply, fill #0
  Filled 2021-08-06: qty 90, 90d supply, fill #0
  Filled 2021-10-30 – 2022-01-17 (×3): qty 30, 30d supply, fill #1

## 2021-08-06 MED ORDER — SERTRALINE HCL 25 MG PO TABS
25.0000 mg | ORAL_TABLET | Freq: Every day | ORAL | 3 refills | Status: DC
  Filled 2021-08-06 (×2): qty 90, 90d supply, fill #0
  Filled 2021-10-30 – 2022-01-17 (×2): qty 90, 90d supply, fill #1
  Filled 2022-07-12: qty 30, 30d supply, fill #2

## 2021-09-24 ENCOUNTER — Other Ambulatory Visit: Payer: Self-pay

## 2021-10-09 ENCOUNTER — Other Ambulatory Visit: Payer: Self-pay

## 2021-10-09 MED ORDER — TAMSULOSIN HCL 0.4 MG PO CAPS
ORAL_CAPSULE | ORAL | 1 refills | Status: DC
  Filled 2021-10-09: qty 30, 30d supply, fill #0
  Filled 2021-10-09 – 2022-01-17 (×3): qty 90, 90d supply, fill #0
  Filled 2022-07-12: qty 30, 30d supply, fill #1

## 2021-10-28 ENCOUNTER — Other Ambulatory Visit: Payer: Self-pay

## 2021-10-28 MED ORDER — WARFARIN SODIUM 4 MG PO TABS
ORAL_TABLET | ORAL | 0 refills | Status: DC
  Filled 2021-10-28: qty 20, 10d supply, fill #0

## 2021-11-05 ENCOUNTER — Other Ambulatory Visit (HOSPITAL_COMMUNITY): Payer: Self-pay

## 2021-12-19 ENCOUNTER — Other Ambulatory Visit (HOSPITAL_COMMUNITY): Payer: Self-pay

## 2021-12-26 ENCOUNTER — Other Ambulatory Visit (HOSPITAL_COMMUNITY): Payer: Self-pay

## 2022-01-17 ENCOUNTER — Other Ambulatory Visit: Payer: Self-pay

## 2022-02-03 ENCOUNTER — Other Ambulatory Visit: Payer: Self-pay

## 2022-02-03 MED ORDER — CLINDAMYCIN HCL 300 MG PO CAPS
ORAL_CAPSULE | ORAL | 0 refills | Status: DC
  Filled 2022-02-03: qty 8, 4d supply, fill #0

## 2022-02-12 ENCOUNTER — Other Ambulatory Visit: Payer: Self-pay

## 2022-02-12 MED ORDER — WARFARIN SODIUM 4 MG PO TABS
ORAL_TABLET | ORAL | 0 refills | Status: DC
  Filled 2022-02-12: qty 8, 4d supply, fill #0

## 2022-02-17 ENCOUNTER — Other Ambulatory Visit: Payer: Self-pay

## 2022-02-17 MED ORDER — ALBUTEROL SULFATE HFA 108 (90 BASE) MCG/ACT IN AERS
INHALATION_SPRAY | RESPIRATORY_TRACT | 11 refills | Status: DC
  Filled 2022-02-17: qty 6.7, 25d supply, fill #0

## 2022-02-17 MED ORDER — BENZONATATE 200 MG PO CAPS
ORAL_CAPSULE | ORAL | 3 refills | Status: DC
  Filled 2022-02-17 (×3): qty 20, 7d supply, fill #0

## 2022-04-29 ENCOUNTER — Other Ambulatory Visit: Payer: Self-pay

## 2022-04-29 MED ORDER — METOPROLOL TARTRATE 25 MG PO TABS
25.0000 mg | ORAL_TABLET | Freq: Two times a day (BID) | ORAL | 0 refills | Status: DC
  Filled 2022-04-29 (×2): qty 12, 6d supply, fill #0

## 2022-07-15 ENCOUNTER — Encounter (HOSPITAL_COMMUNITY): Payer: Self-pay

## 2022-07-15 ENCOUNTER — Other Ambulatory Visit: Payer: Self-pay

## 2022-07-15 ENCOUNTER — Other Ambulatory Visit (HOSPITAL_COMMUNITY): Payer: Self-pay

## 2022-07-18 ENCOUNTER — Other Ambulatory Visit (HOSPITAL_COMMUNITY): Payer: Self-pay

## 2022-08-08 ENCOUNTER — Other Ambulatory Visit: Payer: Self-pay

## 2022-08-08 MED ORDER — METOPROLOL TARTRATE 25 MG PO TABS
25.0000 mg | ORAL_TABLET | Freq: Two times a day (BID) | ORAL | 11 refills | Status: DC
  Filled 2022-08-08: qty 60, 30d supply, fill #0

## 2022-09-08 ENCOUNTER — Other Ambulatory Visit: Payer: Self-pay

## 2022-09-08 MED ORDER — EZETIMIBE 10 MG PO TABS
10.0000 mg | ORAL_TABLET | Freq: Every day | ORAL | 1 refills | Status: AC
  Filled 2022-09-08: qty 90, 90d supply, fill #0

## 2022-09-08 MED ORDER — METOPROLOL TARTRATE 25 MG PO TABS
25.0000 mg | ORAL_TABLET | Freq: Two times a day (BID) | ORAL | 3 refills | Status: DC
  Filled 2022-09-08: qty 60, 30d supply, fill #0

## 2022-09-10 ENCOUNTER — Ambulatory Visit: Payer: PRIVATE HEALTH INSURANCE | Admitting: Urology

## 2022-09-10 ENCOUNTER — Encounter: Payer: Self-pay | Admitting: Urology

## 2022-09-10 VITALS — BP 125/88 | HR 105 | Ht 72.0 in | Wt 200.0 lb

## 2022-09-10 DIAGNOSIS — R972 Elevated prostate specific antigen [PSA]: Secondary | ICD-10-CM

## 2022-09-10 NOTE — Progress Notes (Signed)
I, Tyler Valencia,acting as a scribe for Tyler Altes, MD.,have documented all relevant documentation on the behalf of Tyler Altes, MD,as directed by  Tyler Altes, MD while in the presence of Tyler Altes, MD.   Tyler Valencia,acting as a scribe for Tyler Altes, MD., have documented all relevant documentation on the behalf of Tyler Altes, MD, as directed by  Tyler Altes, MD while in the presence of Tyler Altes, MD.   09/10/2022 4:57 PM   Tyler Valencia 1955-10-15 [id number]  Referring provider: Lynnea Ferrier, MD [address] [facility] [place],  [place] [address]  Chief Complaint  Patient presents with   Elevated PSA    HPI: Tyler Valencia is a 67 y.o. male referred for evaluation of elevated PSA.  PSA since 2018 has been in the 2-mid 3.5 range. PSA 08/13/2022 was elevated at 11.78. PSA on 02/07/2021 was 3.56. Urinalysis at the time of his PSA draw did show 14 WBCs. Urine culture was not ordered. He states I did see him around 2010 for an elevated PSA, and he underwent a biopsy, which was benign. The records are no longer available from that practice, and his wife thinks the PSA level may have been in the 11 range. He does have some increased urinary frequency and urgency. Recently started on Tamsulosin, which he was taking every other day due to GI issues, but he has been taking it daily for the last 2 weeks.   PMH: Past Medical History:  Diagnosis Date   AAA (abdominal aortic aneurysm)    Atrial fibrillation    High cholesterol    Status post mechanical aortic valve replacement     Surgical History: Past Surgical History:  Procedure Laterality Date   CARDIAC SURGERY  1996   aortic valve    Home Medications:  Allergies as of 09/10/2022       Reactions   Amoxicillin Hives   Lovastatin    Other reaction(s): Muscle Pain   Rosuvastatin    Other reaction(s): Muscle Pain   Penicillins Rash         Medication List        Accurate as of September 10, 2022  4:57 PM. If you have any questions, ask your nurse or doctor.          STOP taking these medications    albuterol 108 (90 Base) MCG/ACT inhaler Commonly known as: VENTOLIN HFA Stopped by: Tyler Altes, MD   azithromycin 250 MG tablet Commonly known as: Zithromax Stopped by: Tyler Altes, MD   benzonatate 200 MG capsule Commonly known as: TESSALON Stopped by: Tyler Altes, MD   chlorpheniramine-HYDROcodone 10-8 MG/5ML Suer Commonly known as: TUSSIONEX Stopped by: Tyler Altes, MD   clindamycin 300 MG capsule Commonly known as: CLEOCIN Stopped by: Tyler Altes, MD   doxycycline 100 MG capsule Commonly known as: VIBRAMYCIN Stopped by: Tyler Altes, MD   HYDROcodone bit-homatropine 5-1.5 MG/5ML syrup Commonly known as: HYCODAN Stopped by: Tyler Altes, MD   HYDROcodone-homatropine 5-1.5 MG/5ML syrup Commonly known as: HYCODAN Stopped by: Tyler Altes, MD   Paxlovid 20 x 150 MG & 10 x 100MG  Tbpk Generic drug: nirmatrelvir & ritonavir Stopped by: Tyler Altes, MD   predniSONE 10 MG tablet Commonly known as: DELTASONE Stopped by: Tyler Altes, MD   predniSONE 20 MG tablet Commonly known as: DELTASONE Stopped by: Tyler Valencia  Tyler Varano, MD       TAKE these medications    amiodarone 200 MG tablet Commonly known as: PACERONE Take by mouth.   aspirin EC 81 MG tablet Take 81 mg by mouth daily.   ezetimibe 10 MG tablet Commonly known as: ZETIA Take 10 mg by mouth daily. What changed: Another medication with the same name was removed. Continue taking this medication, and follow the directions you see here. Changed by: Tyler Altes, MD   ezetimibe 10 MG tablet Commonly known as: ZETIA Take 1 tablet (10 mg total) by mouth at bedtime What changed: Another medication with the same name was removed. Continue taking this medication, and follow the directions you see  here. Changed by: Tyler Altes, MD   metoprolol tartrate 25 MG tablet Commonly known as: LOPRESSOR Take 25 mg by mouth 2 (two) times daily. What changed: Another medication with the same name was removed. Continue taking this medication, and follow the directions you see here. Changed by: Tyler Altes, MD   metoprolol tartrate 25 MG tablet Commonly known as: LOPRESSOR Take 1 tablet (25 mg total) by mouth 2 (two) times daily What changed: Another medication with the same name was removed. Continue taking this medication, and follow the directions you see here. Changed by: Tyler Altes, MD   oxybutynin 5 MG tablet Commonly known as: DITROPAN Take by mouth.   pantoprazole 20 MG tablet Commonly known as: PROTONIX Take 1 tablet (20 mg total) by mouth once daily What changed: Another medication with the same name was removed. Continue taking this medication, and follow the directions you see here. Changed by: Tyler Altes, MD   sertraline 25 MG tablet Commonly known as: ZOLOFT Take 25 mg by mouth daily. What changed: Another medication with the same name was removed. Continue taking this medication, and follow the directions you see here. Changed by: Tyler Altes, MD   sertraline 25 MG tablet Commonly known as: ZOLOFT Take 1 tablet (25 mg total) by mouth once daily What changed: Another medication with the same name was removed. Continue taking this medication, and follow the directions you see here. Changed by: Tyler Altes, MD   Shingrix injection Generic drug: Zoster Vaccine Adjuvanted Inject into the muscle.   tamsulosin 0.4 MG Caps capsule Commonly known as: FLOMAX Take 1 capsule (0.4 mg total) by mouth at bedtime. Take 30 minutes after same meal each day. What changed: Another medication with the same name was removed. Continue taking this medication, and follow the directions you see here. Changed by: Tyler Altes, MD   traZODone 50 MG  tablet Commonly known as: DESYREL Take 50 mg by mouth at bedtime.   traZODone 50 MG tablet Commonly known as: DESYREL Take 1 tablet (50 mg total) by mouth at bedtime   warfarin 5 MG tablet Commonly known as: COUMADIN Take 5 mg by mouth daily. Take in addition to  for a total of  daily What changed: Another medication with the same name was removed. Continue taking this medication, and follow the directions you see here. Changed by: Tyler Altes, MD   warfarin 4 MG tablet Commonly known as: COUMADIN Take 4 mg by mouth daily. Take in addition to  for a total of  daily What changed: Another medication with the same name was removed. Continue taking this medication, and follow the directions you see here. Changed by: Tyler Altes, MD        Allergies:  Allergies  Allergen Reactions   Amoxicillin Hives   Lovastatin     Other reaction(s): Muscle Pain   Rosuvastatin     Other reaction(s): Muscle Pain   Penicillins Rash    Family History: Family History  Problem Relation Age of Onset   CAD Mother     Social History:  reports that he has quit smoking. His smoking use included cigarettes. He smoked an average of .5 packs per day. He has never used smokeless tobacco. He reports that he does not currently use alcohol. He reports that he does not currently use drugs.   Physical Exam: BP 125/88   Pulse (!) 105   Ht 6' (1.829 m)   Wt 200 lb (90.7 kg)   BMI 27.12 kg/m   Constitutional:  Alert and oriented, No acute distress. HEENT: [place] AT Respiratory: Normal respiratory effort, no increased work of breathing. GI: Abdomen is soft, nontender, nondistended, no abdominal masses GU: Prostate 50 g, smooth without nodules. Psychiatric: Normal mood and affect.  Laboratory Data:  Urinalysis pending  Assessment & Plan:    1. Elevated PSA Benign DRE. Urinalysis, when most recent PSA was drawn at [facility], showed 14 WBCs on microscopy. Repeat PSA today.  If persistent pyuria, will obtain urine culture and repeat PSA after antibiotic treatment. If urinalysis is negative, we will repeat PSA in 2 weeks. If repeat PSA remains persistently elevated, schedule prostate MRI.  I have reviewed the above documentation for accuracy and completeness, and I agree with the above.   Tyler Altes, MD  [facility] [address] [place], [place] [address] [phone]

## 2022-09-11 ENCOUNTER — Other Ambulatory Visit: Payer: Self-pay

## 2022-09-11 LAB — URINALYSIS, COMPLETE
Bilirubin, UA: NEGATIVE
Glucose, UA: NEGATIVE
Ketones, UA: NEGATIVE
Leukocytes,UA: NEGATIVE
Nitrite, UA: NEGATIVE
Protein,UA: NEGATIVE
Specific Gravity, UA: 1.025 (ref 1.005–1.030)
Urobilinogen, Ur: 0.2 mg/dL (ref 0.2–1.0)
pH, UA: 5.5 (ref 5.0–7.5)

## 2022-09-11 LAB — MICROSCOPIC EXAMINATION

## 2022-09-12 ENCOUNTER — Encounter: Payer: Self-pay | Admitting: Urology

## 2022-09-24 ENCOUNTER — Other Ambulatory Visit: Payer: PRIVATE HEALTH INSURANCE

## 2022-09-24 DIAGNOSIS — R972 Elevated prostate specific antigen [PSA]: Secondary | ICD-10-CM

## 2022-09-25 LAB — PSA: Prostate Specific Ag, Serum: 3.2 ng/mL (ref 0.0–4.0)

## 2022-09-26 ENCOUNTER — Encounter: Payer: Self-pay | Admitting: Urology

## 2022-09-30 ENCOUNTER — Other Ambulatory Visit: Payer: Self-pay

## 2022-09-30 MED ORDER — PAXLOVID (300/100) 20 X 150 MG & 10 X 100MG PO TBPK
ORAL_TABLET | ORAL | 0 refills | Status: AC
  Filled 2022-09-30: qty 30, 5d supply, fill #0

## 2022-10-06 ENCOUNTER — Other Ambulatory Visit: Payer: Self-pay | Admitting: Internal Medicine

## 2022-10-06 DIAGNOSIS — R413 Other amnesia: Secondary | ICD-10-CM

## 2022-10-06 DIAGNOSIS — I480 Paroxysmal atrial fibrillation: Secondary | ICD-10-CM

## 2022-10-24 ENCOUNTER — Ambulatory Visit: Payer: PRIVATE HEALTH INSURANCE

## 2022-11-26 ENCOUNTER — Other Ambulatory Visit: Payer: Self-pay | Admitting: Neurology

## 2022-11-26 DIAGNOSIS — R413 Other amnesia: Secondary | ICD-10-CM

## 2022-12-08 ENCOUNTER — Encounter: Payer: Self-pay | Admitting: Neurology

## 2022-12-10 ENCOUNTER — Ambulatory Visit
Admission: RE | Admit: 2022-12-10 | Discharge: 2022-12-10 | Disposition: A | Discharge status: Home / Self Care | Payer: PRIVATE HEALTH INSURANCE | Source: Ambulatory Visit | Attending: Neurology | Admitting: Neurology

## 2022-12-10 DIAGNOSIS — R413 Other amnesia: Secondary | ICD-10-CM

## 2023-01-13 ENCOUNTER — Other Ambulatory Visit: Payer: Self-pay

## 2023-01-13 MED ORDER — WARFARIN SODIUM 4 MG PO TABS
4.0000 mg | ORAL_TABLET | Freq: Every day | ORAL | 0 refills | Status: AC
  Filled 2023-01-13: qty 30, 30d supply, fill #0

## 2023-01-13 MED ORDER — WARFARIN SODIUM 4 MG PO TABS
4.0000 mg | ORAL_TABLET | Freq: Every day | ORAL | 0 refills | Status: AC
  Filled 2023-08-08: qty 30, 30d supply, fill #0

## 2023-01-14 ENCOUNTER — Other Ambulatory Visit: Payer: Self-pay

## 2023-03-09 LAB — EXTERNAL GENERIC LAB PROCEDURE: COLOGUARD: NEGATIVE

## 2023-03-09 LAB — COLOGUARD: COLOGUARD: NEGATIVE

## 2023-08-07 ENCOUNTER — Other Ambulatory Visit: Payer: Self-pay

## 2023-08-07 MED ORDER — WARFARIN SODIUM 4 MG PO TABS
4.0000 mg | ORAL_TABLET | Freq: Every day | ORAL | 0 refills | Status: DC
Start: 2023-08-07 — End: 2023-08-10
  Filled 2023-08-07: qty 8, 8d supply, fill #0

## 2023-08-08 ENCOUNTER — Other Ambulatory Visit (HOSPITAL_COMMUNITY): Payer: Self-pay

## 2023-08-10 ENCOUNTER — Other Ambulatory Visit (HOSPITAL_COMMUNITY): Payer: Self-pay

## 2023-08-10 ENCOUNTER — Other Ambulatory Visit (HOSPITAL_BASED_OUTPATIENT_CLINIC_OR_DEPARTMENT_OTHER): Payer: Self-pay

## 2023-08-10 MED ORDER — WARFARIN SODIUM 4 MG PO TABS
4.0000 mg | ORAL_TABLET | Freq: Every day | ORAL | 0 refills | Status: DC
  Filled 2023-08-10: qty 8, 8d supply, fill #0

## 2023-10-24 ENCOUNTER — Emergency Department: Payer: PRIVATE HEALTH INSURANCE

## 2023-10-24 ENCOUNTER — Emergency Department
Admission: EM | Admit: 2023-10-24 | Discharge: 2023-10-24 | Disposition: A | Discharge status: Home / Self Care | Payer: PRIVATE HEALTH INSURANCE | Attending: Emergency Medicine | Admitting: Emergency Medicine

## 2023-10-24 ENCOUNTER — Other Ambulatory Visit: Payer: Self-pay

## 2023-10-24 DIAGNOSIS — S7002XA Contusion of left hip, initial encounter: Secondary | ICD-10-CM | POA: Insufficient documentation

## 2023-10-24 DIAGNOSIS — M79605 Pain in left leg: Secondary | ICD-10-CM | POA: Diagnosis not present

## 2023-10-24 DIAGNOSIS — J449 Chronic obstructive pulmonary disease, unspecified: Secondary | ICD-10-CM | POA: Insufficient documentation

## 2023-10-24 DIAGNOSIS — S79912A Unspecified injury of left hip, initial encounter: Secondary | ICD-10-CM | POA: Diagnosis present

## 2023-10-24 DIAGNOSIS — M7062 Trochanteric bursitis, left hip: Secondary | ICD-10-CM | POA: Insufficient documentation

## 2023-10-24 DIAGNOSIS — W01198A Fall on same level from slipping, tripping and stumbling with subsequent striking against other object, initial encounter: Secondary | ICD-10-CM | POA: Insufficient documentation

## 2023-10-24 DIAGNOSIS — Y9389 Activity, other specified: Secondary | ICD-10-CM | POA: Diagnosis not present

## 2023-10-24 DIAGNOSIS — Z7901 Long term (current) use of anticoagulants: Secondary | ICD-10-CM | POA: Diagnosis not present

## 2023-10-24 HISTORY — DX: Chronic obstructive pulmonary disease, unspecified: J44.9

## 2023-10-24 MED ORDER — CYCLOBENZAPRINE HCL 5 MG PO TABS: 5.0000 mg | ORAL_TABLET | Freq: Three times a day (TID) | ORAL | 0 refills | Status: AC | PRN

## 2023-10-24 MED ORDER — PREDNISONE 20 MG PO TABS: 40.0000 mg | ORAL_TABLET | Freq: Every day | ORAL | 0 refills | Status: AC

## 2023-10-24 NOTE — ED Notes (Signed)
 Pt fell while getting things out of the back of their truck- felt a pop in their left hip, hit their upper back on the bumper of the truck. Pt is not in pain at the moment, but when the pain hits it's an 8/10. Pt does take a blood thinner.

## 2023-10-24 NOTE — ED Provider Notes (Signed)
 [facility] Emergency Department Provider Note     Event Date/Time   First MD Initiated Contact with Patient 10/24/23 1617     (approximate)   History   Fall and Hip Pain   HPI  Tyler Valencia is a 68 y.o. male with a history of A-fib on Coumadin , HLD, COPD, and AAA that is postrepair, presents to the ED following mechanical fall.  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  He hit his upper back but denies any head injury or LOC.  No chest pain nausea vomiting reported.  Patient reports intermittent pain to the left leg at the hip oriented with range of motion and ambulation.  Physical Exam   Triage Vital Signs: ED Triage Vitals  Encounter Vitals Group     BP 10/24/23 1600 (!) 102/58     Systolic BP Percentile --      Diastolic BP Percentile --      Pulse Rate 10/24/23 1600 73     Resp 10/24/23 1600 20     Temp 10/24/23 1600 98.3 F (36.8 C)     Temp Source 10/24/23 1600 Oral     SpO2 10/24/23 1600 97 %     Weight 10/24/23 1555 200 lb (90.7 kg)     Height 10/24/23 1555 6' (1.829 m)     Head Circumference --      Peak Flow --      Pain Score 10/24/23 1555 5     Pain Loc --      Pain Education --      Exclude from Growth Chart --     Most recent vital signs: Vitals:   10/24/23 1700 10/24/23 1730  BP: 122/73 (!) 108/58  Pulse: (!) 59 (!) 58  Resp:    Temp:    SpO2: 97% 97%    General Awake, no distress. NAD A&O x 4 HEENT NCAT. PERRL. EOMI. No rhinorrhea. Mucous membranes are moist.  CV:  Good peripheral perfusion.  RESP:  Normal effort. CTA ABD:  No distention.  MSK:  Left lower extremity without leg length discrepancy or fixed internal rotation.  Patient able to actively flex and extend the leg at the hip without difficulty.  Normal internal and external rotation on exam.  Normal abduction and adduction range.  Patient  tender palpation over the left trochanter at the bursa.  No gluteal muscle tenderness appreciated. NEURO: Cranial nerves II to XII grossly intact.   ED Results / Procedures / Treatments   Labs (all labs ordered are listed, but only abnormal results are displayed) Labs Reviewed - No data to display   EKG   RADIOLOGY  I personally viewed and evaluated these images as part of my medical decision making, as well as reviewing the written report by the radiologist.  ED Provider Interpretation: No acute bony injury  DG Hip Unilat With Pelvis 2-3 Views Left Result Date: 10/24/2023 CLINICAL DATA:  Fall.  Hip pain. EXAM: DG HIP (WITH OR WITHOUT PELVIS) 2-3V LEFT COMPARISON:  None Available. FINDINGS: Pelvis is intact with normal and symmetric sacroiliac joints. No acute fracture or dislocation. No aggressive osseous lesion. Visualized sacral arcuate lines are unremarkable. Unremarkable symphysis pubis. There are mild degenerative changes of bilateral hip joints without significant joint space narrowing. Osteophytosis of the superior acetabulum. No radiopaque foreign bodies. IMPRESSION: *No acute  osseous abnormality of the pelvis or left hip joint. Electronically Signed   By: Beula Brunswick M.D.   On: 10/24/2023 16:30     PROCEDURES:  Critical Care performed: No  Procedures   MEDICATIONS ORDERED IN ED: Medications - No data to display   IMPRESSION / MDM / ASSESSMENT AND PLAN / ED COURSE  I reviewed the triage vital signs and the nursing notes.                              Differential diagnosis includes, but is not limited to, contusion, fracture, dislocation, myalgia  Patient's presentation is most consistent with acute complicated illness / injury requiring diagnostic workup.  Patient's diagnosis is consistent with hip contusion likely trochanteric bursitis on the left.  Patient with a reassuring exam and x-ray following a mechanical fall.  He would endorse lateral left hip pain at  the time of injury and intermittently with ambulation.  No acute bony injury on the hip x-ray interpreted by me.  Bedside exam reveals normal active range of motion without significant disability or concern for internal derangement to the hip joint.  Patient will be discharged home with prescriptions for baclofen and prednisone . Patient is to follow up with his PCP or [facility] as suggested, as needed or otherwise directed. Patient is given ED precautions to return to the ED for any worsening or new symptoms.     FINAL CLINICAL IMPRESSION(S) / ED DIAGNOSES   Final diagnoses:  Contusion of left hip, initial encounter  Trochanteric bursitis of left hip     Rx / DC Orders   ED Discharge Orders          Ordered    cyclobenzaprine (FLEXERIL) 5 MG tablet  3 times daily PRN        10/24/23 1730    predniSONE  (DELTASONE ) 20 MG tablet  Daily with breakfast        10/24/23 1730             Note:  This document was prepared using Dragon voice recognition software and may include unintentional dictation errors.    May Sparks, PA-C 10/24/23 2224    Lubertha Rush, MD 10/25/23 1536

## 2023-10-24 NOTE — Discharge Instructions (Signed)
 Your exam and XR are normal and reassuring. No evidence of fracture or dislocation. Apply ice to reduce pain and swelling. Follow-up with your PCP or [facility] as needed.

## 2023-10-24 NOTE — ED Triage Notes (Signed)
 Pt to ED for mechanical fall today. Fell onto L leg and felt 'pop' in L hip. Also hit upper back. Pt takes Coumadin . Alert, oriented. Denies LOC, hitting head. Painful to bear weight.

## 2024-02-25 ENCOUNTER — Other Ambulatory Visit: Payer: Self-pay | Admitting: Internal Medicine

## 2024-02-25 DIAGNOSIS — Z8679 Personal history of other diseases of the circulatory system: Secondary | ICD-10-CM

## 2024-03-03 ENCOUNTER — Ambulatory Visit: Payer: PRIVATE HEALTH INSURANCE
